# Patient Record
Sex: Male | Born: 1950 | Race: Black or African American | Hispanic: No | Marital: Married | Smoking: Former smoker
Health system: Southern US, Community
[De-identification: ages and names within clinical notes are randomized; demographics above are authoritative.]

## PROBLEM LIST (undated history)

## (undated) DIAGNOSIS — T7840XA Allergy, unspecified, initial encounter: Secondary | ICD-10-CM

## (undated) DIAGNOSIS — F319 Bipolar disorder, unspecified: Secondary | ICD-10-CM

## (undated) DIAGNOSIS — N400 Enlarged prostate without lower urinary tract symptoms: Secondary | ICD-10-CM

## (undated) DIAGNOSIS — F32A Depression, unspecified: Secondary | ICD-10-CM

## (undated) DIAGNOSIS — F329 Major depressive disorder, single episode, unspecified: Secondary | ICD-10-CM

## (undated) DIAGNOSIS — F419 Anxiety disorder, unspecified: Secondary | ICD-10-CM

## (undated) DIAGNOSIS — F988 Other specified behavioral and emotional disorders with onset usually occurring in childhood and adolescence: Secondary | ICD-10-CM

## (undated) DIAGNOSIS — C6290 Malignant neoplasm of unspecified testis, unspecified whether descended or undescended: Secondary | ICD-10-CM

## (undated) DIAGNOSIS — R51 Headache: Secondary | ICD-10-CM

## (undated) HISTORY — DX: Major depressive disorder, single episode, unspecified: F32.9

## (undated) HISTORY — DX: Other specified behavioral and emotional disorders with onset usually occurring in childhood and adolescence: F98.8

## (undated) HISTORY — PX: COLONOSCOPY W/ BIOPSIES AND POLYPECTOMY: SHX1376

## (undated) HISTORY — DX: Depression, unspecified: F32.A

## (undated) HISTORY — PX: TESTICLE REMOVAL: SHX68

## (undated) HISTORY — DX: Bipolar disorder, unspecified: F31.9

## (undated) HISTORY — DX: Allergy, unspecified, initial encounter: T78.40XA

---
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
## 1988-10-11 HISTORY — PX: FINGER FRACTURE SURGERY: SHX638

## 2001-12-08 ENCOUNTER — Encounter: Payer: Self-pay | Admitting: Urology

## 2001-12-08 ENCOUNTER — Ambulatory Visit (HOSPITAL_COMMUNITY): Admission: RE | Admit: 2001-12-08 | Discharge: 2001-12-08 | Payer: Self-pay | Admitting: Urology

## 2001-12-21 ENCOUNTER — Ambulatory Visit (HOSPITAL_BASED_OUTPATIENT_CLINIC_OR_DEPARTMENT_OTHER): Admission: RE | Admit: 2001-12-21 | Discharge: 2001-12-21 | Payer: Self-pay | Admitting: Urology

## 2001-12-21 ENCOUNTER — Encounter: Payer: Self-pay | Admitting: Urology

## 2001-12-21 ENCOUNTER — Encounter (INDEPENDENT_AMBULATORY_CARE_PROVIDER_SITE_OTHER): Payer: Self-pay

## 2001-12-27 ENCOUNTER — Encounter: Payer: Self-pay | Admitting: Urology

## 2001-12-27 ENCOUNTER — Encounter: Admission: RE | Admit: 2001-12-27 | Discharge: 2001-12-27 | Payer: Self-pay | Admitting: Urology

## 2001-12-31 ENCOUNTER — Encounter: Admission: RE | Admit: 2001-12-31 | Discharge: 2001-12-31 | Payer: Self-pay | Admitting: Urology

## 2001-12-31 ENCOUNTER — Encounter: Payer: Self-pay | Admitting: Urology

## 2002-01-14 ENCOUNTER — Ambulatory Visit: Admission: RE | Admit: 2002-01-14 | Discharge: 2002-03-30 | Payer: Self-pay | Admitting: *Deleted

## 2003-05-01 ENCOUNTER — Ambulatory Visit (HOSPITAL_COMMUNITY): Admission: RE | Admit: 2003-05-01 | Discharge: 2003-05-01 | Payer: Self-pay | Admitting: Urology

## 2004-02-08 ENCOUNTER — Ambulatory Visit: Payer: Self-pay | Admitting: Family Medicine

## 2004-02-09 ENCOUNTER — Encounter: Admission: RE | Admit: 2004-02-09 | Discharge: 2004-02-09 | Payer: Self-pay | Admitting: Family Medicine

## 2005-01-07 ENCOUNTER — Ambulatory Visit: Payer: Self-pay | Admitting: Family Medicine

## 2005-01-24 ENCOUNTER — Ambulatory Visit: Payer: Self-pay | Admitting: Family Medicine

## 2005-03-31 ENCOUNTER — Ambulatory Visit: Payer: Self-pay | Admitting: Internal Medicine

## 2006-05-27 ENCOUNTER — Ambulatory Visit: Payer: Self-pay | Admitting: Family Medicine

## 2006-07-29 ENCOUNTER — Ambulatory Visit: Payer: Self-pay | Admitting: Family Medicine

## 2006-08-10 ENCOUNTER — Ambulatory Visit: Payer: Self-pay | Admitting: Family Medicine

## 2006-08-10 LAB — CONVERTED CEMR LAB
ALT: 20 units/L (ref 0–53)
AST: 22 units/L (ref 0–37)
Albumin: 4.1 g/dL (ref 3.5–5.2)
Alkaline Phosphatase: 52 units/L (ref 39–117)
BUN: 17 mg/dL (ref 6–23)
Basophils Absolute: 0 10*3/uL (ref 0.0–0.1)
Basophils Relative: 0.9 % (ref 0.0–1.0)
CO2: 32 meq/L (ref 19–32)
Calcium: 10.1 mg/dL (ref 8.4–10.5)
Chloride: 110 meq/L (ref 96–112)
Cholesterol: 233 mg/dL (ref 0–200)
Creatinine, Ser: 1.2 mg/dL (ref 0.4–1.5)
Hemoglobin: 13.9 g/dL (ref 13.0–17.0)
Monocytes Absolute: 0.5 10*3/uL (ref 0.2–0.7)
Monocytes Relative: 10.6 % (ref 3.0–11.0)
Platelets: 198 10*3/uL (ref 150–400)
RBC: 4.24 M/uL (ref 4.22–5.81)
RDW: 11.7 % (ref 11.5–14.6)
Total Bilirubin: 1.3 mg/dL — ABNORMAL HIGH (ref 0.3–1.2)
Total CHOL/HDL Ratio: 4.5
Total Protein: 7.9 g/dL (ref 6.0–8.3)
Triglycerides: 102 mg/dL (ref 0–149)
VLDL: 20 mg/dL (ref 0–40)

## 2006-08-17 ENCOUNTER — Ambulatory Visit: Payer: Self-pay | Admitting: Family Medicine

## 2006-08-24 ENCOUNTER — Ambulatory Visit: Payer: Self-pay | Admitting: Family Medicine

## 2006-12-15 ENCOUNTER — Ambulatory Visit: Payer: Self-pay | Admitting: Family Medicine

## 2007-01-11 ENCOUNTER — Ambulatory Visit: Payer: Self-pay | Admitting: Family Medicine

## 2007-03-03 ENCOUNTER — Telehealth: Payer: Self-pay | Admitting: Internal Medicine

## 2007-05-24 ENCOUNTER — Telehealth: Payer: Self-pay | Admitting: Family Medicine

## 2007-06-22 ENCOUNTER — Telehealth (INDEPENDENT_AMBULATORY_CARE_PROVIDER_SITE_OTHER): Payer: Self-pay | Admitting: *Deleted

## 2007-08-02 ENCOUNTER — Ambulatory Visit: Payer: Self-pay | Admitting: Family Medicine

## 2007-08-02 LAB — CONVERTED CEMR LAB
ALT: 19 units/L (ref 0–53)
Albumin: 4.1 g/dL (ref 3.5–5.2)
Basophils Absolute: 0 10*3/uL (ref 0.0–0.1)
Basophils Relative: 0.8 % (ref 0.0–1.0)
Bilirubin Urine: NEGATIVE
Blood in Urine, dipstick: NEGATIVE
CO2: 30 meq/L (ref 19–32)
Calcium: 9.5 mg/dL (ref 8.4–10.5)
Cholesterol: 219 mg/dL (ref 0–200)
Creatinine, Ser: 1 mg/dL (ref 0.4–1.5)
Direct LDL: 142.1 mg/dL
Glucose, Bld: 99 mg/dL (ref 70–99)
Hemoglobin: 13.3 g/dL (ref 13.0–17.0)
Ketones, urine, test strip: NEGATIVE
Lymphocytes Relative: 29.6 % (ref 12.0–46.0)
MCHC: 34.6 g/dL (ref 30.0–36.0)
Neutro Abs: 2.2 10*3/uL (ref 1.4–7.7)
Nitrite: NEGATIVE
PSA: 2.29 ng/mL (ref 0.10–4.00)
RBC: 3.94 M/uL — ABNORMAL LOW (ref 4.22–5.81)
Total Protein: 7.8 g/dL (ref 6.0–8.3)
Urobilinogen, UA: 0.2
VLDL: 28 mg/dL (ref 0–40)

## 2007-09-13 ENCOUNTER — Ambulatory Visit: Payer: Self-pay | Admitting: Gastroenterology

## 2007-11-22 ENCOUNTER — Ambulatory Visit: Payer: Self-pay | Admitting: Gastroenterology

## 2008-02-28 ENCOUNTER — Ambulatory Visit: Payer: Self-pay | Admitting: Family Medicine

## 2008-05-18 ENCOUNTER — Ambulatory Visit: Payer: Self-pay | Admitting: Family Medicine

## 2008-06-22 ENCOUNTER — Telehealth: Payer: Self-pay | Admitting: Family Medicine

## 2008-07-26 ENCOUNTER — Ambulatory Visit: Payer: Self-pay | Admitting: Family Medicine

## 2008-07-26 LAB — CONVERTED CEMR LAB
AST: 17 units/L (ref 0–37)
BUN: 22 mg/dL (ref 6–23)
Basophils Absolute: 0.1 10*3/uL (ref 0.0–0.1)
Calcium: 9.3 mg/dL (ref 8.4–10.5)
Eosinophils Absolute: 0.2 10*3/uL (ref 0.0–0.7)
GFR calc non Af Amer: 88.4 mL/min (ref >60)
Glucose, Bld: 99 mg/dL (ref 70–99)
HCT: 37.9 % — ABNORMAL LOW (ref 39.0–52.0)
Lymphocytes Relative: 33.4 % (ref 12.0–46.0)
Lymphs Abs: 1.9 10*3/uL (ref 0.7–4.0)
MCHC: 34.8 g/dL (ref 30.0–36.0)
Monocytes Relative: 9.6 % (ref 3.0–12.0)
Nitrite: NEGATIVE
Platelets: 174 10*3/uL (ref 150.0–400.0)
RDW: 11.9 % (ref 11.5–14.6)
TSH: 1.72 microintl units/mL (ref 0.35–5.50)
Total Bilirubin: 1 mg/dL (ref 0.3–1.2)
Urobilinogen, UA: 0.2

## 2008-07-31 ENCOUNTER — Telehealth: Payer: Self-pay | Admitting: Family Medicine

## 2008-09-28 ENCOUNTER — Ambulatory Visit: Payer: Self-pay | Admitting: Internal Medicine

## 2009-02-01 ENCOUNTER — Ambulatory Visit: Payer: Self-pay | Admitting: Family Medicine

## 2010-06-28 NOTE — Op Note (Signed)
NAME:  Adam Herring, Adam Herring NO.:  [id number]   MEDICAL RECORD NO.:  [id number]                   PATIENT TYPE:  AMB   LOCATION:  [facility]                                 FACILITY:  [facility]   PHYSICIAN:  Excell Seltzer. Annabell Howells, M.D.                 DATE OF BIRTH:  03/09/1950   DATE OF PROCEDURE:  12/21/2001  DATE OF DISCHARGE:                                 OPERATIVE REPORT   PROCEDURE:  Left testicular exploration with biopsy and left radical  inguinal orchiectomy.   PREOPERATIVE DIAGNOSES:  Left testicular mass, rule out neoplasm versus  abscess.   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   SURGEON:  Excell Seltzer. Annabell Howells, M.D.   ANESTHESIA:  General.   SPECIMENS:  1. Left testicular frozen section biopsy suspicious for necrotic neoplasm.  2. Remainder of left testicle and cord.   COMPLICATIONS:  None.   INDICATIONS FOR PROCEDURE:  Adam Herring is a 60 year old black male who  originally presented with acute onset left scrotal pain and swelling. An  ultrasound demonstrated what appeared to be a necrotic mass versus abscess  in the left testicle. The inflammatory process resolved, the mass persisted  and it was felt that left testicular exploration through an inguinal  approach with biopsy was indicated with probable radical inguinal  orchiectomy.   FINDINGS AND PROCEDURE:  The patient was given p.o. Levaquin, he was taken  to the operating room where a general anesthetic was induced. He was placed  in the supine position, general anesthetic was induced. His left inguinal  area was shaved, he was prepped with Betadine solution and draped in the  usual sterile fashion. An oblique inguinal incision was made along the skin  lines just lateral and superior to the pubic tubercle. The subcutaneous  tissue was opened with the Bovie, a subcutaneous vein was cauterized and  divided. The Scarpa's fascia was opened, the external oblique fascia  was  identified and exposed and then incised along the lines of the fascial  fibers exposing the inguinal canal. The ilioinguinal nerve was identified  and was avoided. The cord was bluntly dissected out and a 1/4 inch Penrose  drain was doubly wrapped around the base of the cord, internal ring and  secured snugly to control the vascular supply of the testicle. The testicle  was then delivered from the scrotum and the gubernacular attachments were  taken down. At this point, the testicle is isolated on green towels, the  tunica vaginalis is opened and the testicle is bivalved exposing the lesion  in the lower pole of the testicle. It had a fleshy rind that measured  approximately 3-4 mm thick with a necrotic appearing yellowish center. There  was very little fluid and it was more consistent with necrotic tissue than  abscess. Some of both the central necrotic tissue and the rind was excised  using  Metzenbaum scissors and sent for frozen section analysis. This  demonstrated a necrotic cellular appearance more consistent with a necrotic  neoplasm than an inflammatory infectious process. Very few neutrophils were  seen. With this information, I elected to go ahead and perform the radical  orchiectomy. The cord was divided into two packets at the internal ring and  clamped with hemostats. It was then divided. The specimen was isolated and  removed off the field and each of the sections of the cord was doubly  ligated with #0 Vicryl ties. One tie was left long for future identification  if necessary. At this point, the inguinal canal was inspected for hemostasis  and no active bleeding was identified. The subcutaneous tissues were  infiltrated with about 10 cc of 0.25% Marcaine for postoperative pain  control. The external oblique fascia was reapproximated using running 3-0  Vicryl once again with care taken to avoid the ilioinguinal nerve. The  subcutaneous tissues were closed with interrupted  3-0 Vicryl stitches and  the skin was closed with an intracuticular 4-0 Vicryl. The wound was then  reinforced with tinctured Benzoin and Steri-Strips and a dressing was  applied. The scrotum was pulled down to ensure it was not entrapped. The  patient's anesthetic was reversed. He was removed to the recovery room in  stable condition. There were no complications.                                               Excell Seltzer. Annabell Howells, M.D.    JJW/MEDQ  D:  12/21/2001  T:  12/21/2001  Job:  [id number]

## 2010-11-20 ENCOUNTER — Other Ambulatory Visit: Payer: Self-pay | Admitting: Family Medicine

## 2011-04-01 ENCOUNTER — Telehealth: Payer: Self-pay | Admitting: Family Medicine

## 2011-04-01 NOTE — Telephone Encounter (Signed)
Spoke with patient and an appointment made 

## 2011-04-01 NOTE — Telephone Encounter (Addendum)
Pt. Wants to re-establish with Dr. Todd// states he may be having some issues with his prostate and has family history of prostate cancer.  However he wants to be seen as soon as possible but there wasn't anything available right away can he be fit in somewhere or what are his options..please call and let him know. Pt called back and also stated that he may have a possible STD, he received a call from a former girlfriend who alerted him about this recently..didn't state what it was but said he is extremely concerned.

## 2011-04-02 ENCOUNTER — Other Ambulatory Visit: Payer: Self-pay | Admitting: *Deleted

## 2011-04-02 ENCOUNTER — Ambulatory Visit (INDEPENDENT_AMBULATORY_CARE_PROVIDER_SITE_OTHER): Payer: BC Managed Care – PPO | Admitting: Family Medicine

## 2011-04-02 ENCOUNTER — Encounter: Payer: Self-pay | Admitting: Family Medicine

## 2011-04-02 DIAGNOSIS — N341 Nonspecific urethritis: Secondary | ICD-10-CM | POA: Insufficient documentation

## 2011-04-02 DIAGNOSIS — F319 Bipolar disorder, unspecified: Secondary | ICD-10-CM

## 2011-04-02 DIAGNOSIS — F3130 Bipolar disorder, current episode depressed, mild or moderate severity, unspecified: Secondary | ICD-10-CM

## 2011-04-02 LAB — BASIC METABOLIC PANEL
BUN: 26 mg/dL — ABNORMAL HIGH (ref 6–23)
Chloride: 107 mEq/L (ref 96–112)
Potassium: 5.3 mEq/L — ABNORMAL HIGH (ref 3.5–5.1)

## 2011-04-02 LAB — TSH: TSH: 0.7 u[IU]/mL (ref 0.35–5.50)

## 2011-04-02 LAB — CBC WITH DIFFERENTIAL/PLATELET
Basophils Absolute: 0 10*3/uL (ref 0.0–0.1)
Eosinophils Relative: 11.6 % — ABNORMAL HIGH (ref 0.0–5.0)
Lymphocytes Relative: 31.7 % (ref 12.0–46.0)
Monocytes Relative: 9 % (ref 3.0–12.0)
Neutrophils Relative %: 46.7 % (ref 43.0–77.0)
Platelets: 177 10*3/uL (ref 150.0–400.0)
WBC: 4.6 10*3/uL (ref 4.5–10.5)

## 2011-04-02 LAB — POCT URINALYSIS DIPSTICK
Bilirubin, UA: NEGATIVE
Ketones, UA: NEGATIVE
Leukocytes, UA: NEGATIVE
Nitrite, UA: NEGATIVE
Protein, UA: NEGATIVE

## 2011-04-02 LAB — LIPID PANEL
Cholesterol: 211 mg/dL — ABNORMAL HIGH (ref 0–200)
HDL: 55.8 mg/dL (ref >39.00)
Triglycerides: 127 mg/dL (ref 0.0–149.0)

## 2011-04-02 LAB — HEPATIC FUNCTION PANEL
ALT: 18 U/L (ref 0–53)
Albumin: 4.3 g/dL (ref 3.5–5.2)
Total Bilirubin: 0.5 mg/dL (ref 0.3–1.2)
Total Protein: 7.5 g/dL (ref 6.0–8.3)

## 2011-04-02 LAB — PSA: PSA: 2.75 ng/mL (ref 0.10–4.00)

## 2011-04-02 MED ORDER — METRONIDAZOLE 500 MG PO TABS: ORAL_TABLET | ORAL | Status: DC

## 2011-04-02 NOTE — Patient Instructions (Signed)
Take the Flagyl one twice daily for one week  Lab work today  Set up a 30 minute appointment sometime in the next couple weeks for general medical exam

## 2011-04-02 NOTE — Progress Notes (Signed)
  Subjective:    Patient ID: Adam Herring, male    DOB: February 15, 1950, 61 y.o.   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 He is currently treated by his psychiatrist Dr. Evelene Croon  every 6 months  His new girlfriend went for a physical examination and was told she had trichomonas. He is asymptomatic. I recommended he be treated  He also has not had a physical examination in over 3 years.   Review of Systems    general and genitourinary review of systems negative Objective:   Physical Exam  Well-developed well-nourished male in no acute distress genitalia exam normal      Assessment & Plan:  Contact with trichomonas asymptomatic treat

## 2011-04-03 LAB — LDL CHOLESTEROL, DIRECT: Direct LDL: 135.8 mg/dL

## 2011-04-14 ENCOUNTER — Encounter: Payer: Self-pay | Admitting: Family Medicine

## 2011-04-14 ENCOUNTER — Ambulatory Visit (INDEPENDENT_AMBULATORY_CARE_PROVIDER_SITE_OTHER): Payer: BC Managed Care – PPO | Admitting: Family Medicine

## 2011-04-14 DIAGNOSIS — N529 Male erectile dysfunction, unspecified: Secondary | ICD-10-CM

## 2011-04-14 DIAGNOSIS — Z860100 Personal history of colon polyps, unspecified: Secondary | ICD-10-CM

## 2011-04-14 DIAGNOSIS — F319 Bipolar disorder, unspecified: Secondary | ICD-10-CM

## 2011-04-14 DIAGNOSIS — Z23 Encounter for immunization: Secondary | ICD-10-CM

## 2011-04-14 DIAGNOSIS — F909 Attention-deficit hyperactivity disorder, unspecified type: Secondary | ICD-10-CM | POA: Insufficient documentation

## 2011-04-14 DIAGNOSIS — F3130 Bipolar disorder, current episode depressed, mild or moderate severity, unspecified: Secondary | ICD-10-CM

## 2011-04-14 DIAGNOSIS — Z0000 Encounter for general adult medical examination without abnormal findings: Secondary | ICD-10-CM

## 2011-04-14 MED ORDER — TADALAFIL 20 MG PO TABS: 20.0000 mg | ORAL_TABLET | Freq: Every day | ORAL | Status: DC | PRN

## 2011-04-14 NOTE — Patient Instructions (Signed)
Return sometime in the next 3-4 weeks for removal of the lesion on your left leg  We will call GI and get you set of for followup colonoscopy because of your history of colon polyps  Return once year for general physical examination sooner if any problems

## 2011-04-14 NOTE — Progress Notes (Signed)
Addended by: Kern Reap B on: 04/14/2011 01:53 PM   Modules accepted: Orders

## 2011-04-14 NOTE — Progress Notes (Signed)
  Subjective:    Patient ID: Adam Herring, male    DOB: October 28, 1950, 61 y.o.   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 He also has a history of underlying bipolar depression and is on Lamictal 150 mg daily. Both of these medications are given to him by a psychiatrist. He has been given disability for these 2 diagnoses  He uses Cialis when necessary for erectile dysfunction  He gets routine eye care, dental care, colonoscopy x2 both of which showed polyps. He's due for followup colonoscopy. Tetanus booster unknown will give him a booster today information given on shingles vaccine.  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    Review of Systems  Constitutional: Negative.   HENT: Negative.   Eyes: Negative.   Respiratory: Negative.   Cardiovascular: Negative.   Gastrointestinal: Negative.   Genitourinary: Negative.   Musculoskeletal: Negative.   Skin: Negative.   Neurological: Negative.   Hematological: Negative.   Psychiatric/Behavioral: Negative.        Objective:   Physical Exam  Constitutional: He is oriented to person, place, and time. He appears well-developed and well-nourished.  HENT:  Head: Normocephalic and atraumatic.  Right Ear: External ear normal.  Left Ear: External ear normal.  Nose: Nose normal.  Mouth/Throat: Oropharynx is clear and moist.  Eyes: Conjunctivae and EOM are normal. Pupils are equal, round, and reactive to light.  Neck: Normal range of motion. Neck supple. No JVD present. No tracheal deviation present. No thyromegaly present.  Cardiovascular: Normal rate, regular rhythm, normal heart sounds and intact distal pulses.  Exam reveals no gallop and no friction rub.   No murmur heard. Pulmonary/Chest: Effort normal and breath sounds  normal. No stridor. No respiratory distress. He has no wheezes. He has no rales. He exhibits no tenderness.  Abdominal: Soft. Bowel sounds are normal. He exhibits no distension and no mass. There is no tenderness. There is no rebound and no guarding.  Genitourinary: Rectum normal, prostate normal and penis normal. Guaiac negative stool. No penile tenderness.       Left testicle removed surgically right normal  Musculoskeletal: Normal range of motion. He exhibits no edema and no tenderness.  Lymphadenopathy:    He has no cervical adenopathy.  Neurological: He is alert and oriented to person, place, and time. He has normal reflexes. No cranial nerve deficit. He exhibits normal muscle tone.  Skin: Skin is warm and dry. No rash noted. No erythema. No pallor.       He has a sebaceous cyst on his back that I removed with a sterile needle also has a lesion on his left medial thigh this irritated itching growing would like removed.  Psychiatric: He has a normal mood and affect. His behavior is normal. Judgment and thought content normal.          Assessment & Plan:  Healthy male  History of adult ADHD continue Adderall 20 mg 3 times a day from psychiatrist  History of bipolar depression continue Lamictal 150 mg daily via psychiatrist  Erectile dysfunction continue Cialis when necessary  Abnormal lesion left lower extremity return for removal  History of testicular cancer asymptomatic

## 2011-04-15 ENCOUNTER — Encounter: Payer: Self-pay | Admitting: Gastroenterology

## 2011-05-13 ENCOUNTER — Encounter: Payer: Self-pay | Admitting: Family Medicine

## 2011-05-13 ENCOUNTER — Ambulatory Visit (INDEPENDENT_AMBULATORY_CARE_PROVIDER_SITE_OTHER): Payer: BC Managed Care – PPO | Admitting: Family Medicine

## 2011-05-13 DIAGNOSIS — L720 Epidermal cyst: Secondary | ICD-10-CM | POA: Insufficient documentation

## 2011-05-13 DIAGNOSIS — L723 Sebaceous cyst: Secondary | ICD-10-CM

## 2011-05-13 MED ORDER — TRIAMCINOLONE ACETONIDE 0.025 % EX OINT: TOPICAL_OINTMENT | Freq: Two times a day (BID) | CUTANEOUS | Status: AC

## 2011-05-13 NOTE — Progress Notes (Signed)
  Subjective:    Patient ID: Adam Herring, male    DOB: [birth year], 61 y.o.   MRN: [id number]  HPI Adam Herring is a 62 year old male who comes in today for removal of an enlarging dark lesion on the medial portion of his left thigh  After informed consent he was taken to the treatment room the area was cleaned with alcohol anesthetized with 1% Xylocaine with epinephrine and removed with 3 mm margins.  Size of lesion was 10 mm x10 mm  The base was cauterized a dry sterile dressing was applied the lesion was sent for pathologic analysis it appears clinically to be an inclusion cyst rule out dysplastic nevus   Review of Systems    general and dermatologic review of systems otherwise negative Objective:   Physical Exam  Procedure see above      Assessment & Plan:  Lesion removal left lower extremity probable inclusion cyst rule out dysplastic nevus

## 2011-05-13 NOTE — Patient Instructions (Addendum)
Remove the Band-Aid tomorrow  Within 2 weeks we will call you the report is call you as an

## 2011-05-19 ENCOUNTER — Encounter: Payer: BC Managed Care – PPO | Admitting: Gastroenterology

## 2011-05-21 ENCOUNTER — Other Ambulatory Visit: Payer: Self-pay | Admitting: Family Medicine

## 2011-05-21 DIAGNOSIS — IMO0002 Reserved for concepts with insufficient information to code with codable children: Secondary | ICD-10-CM

## 2011-05-29 ENCOUNTER — Telehealth: Payer: Self-pay | Admitting: *Deleted

## 2011-05-29 NOTE — Telephone Encounter (Signed)
No show for previsit.  Pt. Will call at later date to [facility] colon and previsit

## 2011-06-11 ENCOUNTER — Encounter: Payer: BC Managed Care – PPO | Admitting: Gastroenterology

## 2011-11-03 ENCOUNTER — Encounter: Payer: Self-pay | Admitting: Gastroenterology

## 2012-06-10 ENCOUNTER — Encounter: Payer: Self-pay | Admitting: Family Medicine

## 2012-06-15 ENCOUNTER — Other Ambulatory Visit: Payer: BC Managed Care – PPO

## 2012-06-16 ENCOUNTER — Other Ambulatory Visit: Payer: BC Managed Care – PPO

## 2012-06-17 ENCOUNTER — Other Ambulatory Visit (INDEPENDENT_AMBULATORY_CARE_PROVIDER_SITE_OTHER): Payer: BC Managed Care – PPO

## 2012-06-17 ENCOUNTER — Other Ambulatory Visit: Payer: BC Managed Care – PPO

## 2012-06-17 DIAGNOSIS — Z0000 Encounter for general adult medical examination without abnormal findings: Secondary | ICD-10-CM

## 2012-06-17 LAB — BASIC METABOLIC PANEL
BUN: 29 mg/dL — ABNORMAL HIGH (ref 6–23)
CO2: 28 mEq/L (ref 19–32)
Chloride: 104 mEq/L (ref 96–112)
Creatinine, Ser: 1.1 mg/dL (ref 0.4–1.5)
Glucose, Bld: 101 mg/dL — ABNORMAL HIGH (ref 70–99)
Potassium: 5 mEq/L (ref 3.5–5.1)

## 2012-06-17 LAB — POCT URINALYSIS DIPSTICK
Bilirubin, UA: NEGATIVE
Blood, UA: NEGATIVE
Glucose, UA: NEGATIVE
Ketones, UA: NEGATIVE
Leukocytes, UA: NEGATIVE
Nitrite, UA: NEGATIVE
pH, UA: 6

## 2012-06-17 LAB — LIPID PANEL
Cholesterol: 212 mg/dL — ABNORMAL HIGH (ref 0–200)
Total CHOL/HDL Ratio: 4
Triglycerides: 94 mg/dL (ref 0.0–149.0)

## 2012-06-17 LAB — HEPATIC FUNCTION PANEL
Albumin: 4.2 g/dL (ref 3.5–5.2)
Bilirubin, Direct: 0.1 mg/dL (ref 0.0–0.3)
Total Bilirubin: 1.1 mg/dL (ref 0.3–1.2)
Total Protein: 7.5 g/dL (ref 6.0–8.3)

## 2012-06-17 LAB — CBC WITH DIFFERENTIAL/PLATELET
Eosinophils Absolute: 0.5 10*3/uL (ref 0.0–0.7)
HCT: 37.5 % — ABNORMAL LOW (ref 39.0–52.0)
Lymphs Abs: 1.6 10*3/uL (ref 0.7–4.0)
MCHC: 34.4 g/dL (ref 30.0–36.0)
MCV: 95.6 fl (ref 78.0–100.0)
Monocytes Absolute: 0.5 10*3/uL (ref 0.1–1.0)
Neutrophils Relative %: 49.7 % (ref 43.0–77.0)
Platelets: 197 10*3/uL (ref 150.0–400.0)

## 2012-06-17 LAB — TSH: TSH: 1.3 u[IU]/mL (ref 0.35–5.50)

## 2012-06-21 ENCOUNTER — Encounter: Payer: BC Managed Care – PPO | Admitting: Family Medicine

## 2012-06-23 ENCOUNTER — Encounter: Payer: BC Managed Care – PPO | Admitting: Family Medicine

## 2012-08-06 ENCOUNTER — Other Ambulatory Visit: Payer: BC Managed Care – PPO

## 2012-08-23 ENCOUNTER — Encounter: Payer: BC Managed Care – PPO | Admitting: Family Medicine

## 2013-04-14 ENCOUNTER — Emergency Department (HOSPITAL_COMMUNITY)
Admission: EM | Admit: 2013-04-14 | Discharge: 2013-04-14 | Disposition: A | Discharge status: Home / Self Care | Payer: BC Managed Care – PPO | Attending: Emergency Medicine | Admitting: Emergency Medicine

## 2013-04-14 ENCOUNTER — Emergency Department (HOSPITAL_COMMUNITY): Payer: BC Managed Care – PPO

## 2013-04-14 ENCOUNTER — Encounter (HOSPITAL_COMMUNITY): Payer: Self-pay | Admitting: Emergency Medicine

## 2013-04-14 DIAGNOSIS — Z8547 Personal history of malignant neoplasm of testis: Secondary | ICD-10-CM | POA: Insufficient documentation

## 2013-04-14 DIAGNOSIS — Z79899 Other long term (current) drug therapy: Secondary | ICD-10-CM | POA: Insufficient documentation

## 2013-04-14 DIAGNOSIS — IMO0002 Reserved for concepts with insufficient information to code with codable children: Secondary | ICD-10-CM | POA: Insufficient documentation

## 2013-04-14 DIAGNOSIS — Y9389 Activity, other specified: Secondary | ICD-10-CM | POA: Insufficient documentation

## 2013-04-14 DIAGNOSIS — W230XXA Caught, crushed, jammed, or pinched between moving objects, initial encounter: Secondary | ICD-10-CM | POA: Insufficient documentation

## 2013-04-14 DIAGNOSIS — Y929 Unspecified place or not applicable: Secondary | ICD-10-CM | POA: Insufficient documentation

## 2013-04-14 DIAGNOSIS — F988 Other specified behavioral and emotional disorders with onset usually occurring in childhood and adolescence: Secondary | ICD-10-CM | POA: Insufficient documentation

## 2013-04-14 DIAGNOSIS — F319 Bipolar disorder, unspecified: Secondary | ICD-10-CM | POA: Insufficient documentation

## 2013-04-14 DIAGNOSIS — Z792 Long term (current) use of antibiotics: Secondary | ICD-10-CM | POA: Insufficient documentation

## 2013-04-14 DIAGNOSIS — J45909 Unspecified asthma, uncomplicated: Secondary | ICD-10-CM | POA: Insufficient documentation

## 2013-04-14 DIAGNOSIS — S68625A Partial traumatic transphalangeal amputation of left ring finger, initial encounter: Secondary | ICD-10-CM

## 2013-04-14 MED ORDER — HYDROCODONE-ACETAMINOPHEN 5-325 MG PO TABS
2.0000 | ORAL_TABLET | Freq: Once | ORAL | Status: AC
Start: 2013-04-14 — End: 2013-04-14
  Administered 2013-04-14: 2 via ORAL
  Filled 2013-04-14: qty 2

## 2013-04-14 MED ORDER — HYDROCODONE-ACETAMINOPHEN 5-325 MG PO TABS: 2.0000 | ORAL_TABLET | ORAL | Status: DC | PRN

## 2013-04-14 MED ORDER — CEPHALEXIN 500 MG PO CAPS: 500.0000 mg | ORAL_CAPSULE | Freq: Four times a day (QID) | ORAL | Status: DC

## 2013-04-14 MED ORDER — BACITRACIN 500 UNIT/GM EX OINT
1.0000 "application " | TOPICAL_OINTMENT | Freq: Two times a day (BID) | CUTANEOUS | Status: DC
  Administered 2013-04-14: 1 via TOPICAL

## 2013-04-14 NOTE — ED Provider Notes (Signed)
CSN: [id number]     Arrival date & time 04/14/13  0042 History   First MD Initiated Contact with Patient 04/14/13 0247     Chief Complaint  Patient presents with  . Finger Injury     (Consider location/radiation/quality/duration/timing/severity/associated sxs/prior Treatment) HPI Comments: 63 year old male presents with acute onset of injury to his distal left fourth finger which occurred just prior to arrival when his finger got caught in a wood working instrument causing a partial amputation of the tip of the finger. Symptoms are persistent, worse with palpation, associated with a small amount of bleeding though a dressing was placed prior to arrival to help control the bleeding.  The history is provided by the patient.    Past Medical History  Diagnosis Date  . ADD (attention deficit disorder)   . Allergy   . Asthma   . Cancer     testicular  . Depression   . Bipolar 1 disorder    Past Surgical History  Procedure Laterality Date  . Colon surgery     Family History  Problem Relation Age of Onset  . Asthma Other   . Hypertension Other   . Cancer Other    History  Substance Use Topics  . Smoking status: Never Smoker   . Smokeless tobacco: Not on file  . Alcohol Use: Yes    Review of Systems  Skin: Positive for wound.  Neurological: Negative for numbness.      Allergies  Review of patient's allergies indicates no known allergies.  Home Medications   Current Outpatient Rx  Name  Route  Sig  Dispense  Refill  . albuterol (PROVENTIL HFA;VENTOLIN HFA) 108 (90 BASE) MCG/ACT inhaler   Inhalation   Inhale 2 puffs into the lungs every 6 (six) hours as needed.         Marland Kitchen amphetamine-dextroamphetamine (ADDERALL) 20 MG tablet   Oral   Take 20 mg by mouth 3 (three) times daily.         . cephALEXin (KEFLEX) 500 MG capsule   Oral   Take 1 capsule (500 mg total) by mouth 4 (four) times daily.   40 capsule   0   . HYDROcodone-acetaminophen (NORCO/VICODIN) 5-325  MG per tablet   Oral   Take 2 tablets by mouth every 4 (four) hours as needed.   10 tablet   0   . lamoTRIgine (LAMICTAL) 150 MG tablet   Oral   Take 150 mg by mouth daily.         . tadalafil (CIALIS) 20 MG tablet   Oral   Take 1 tablet (20 mg total) by mouth daily as needed for erectile dysfunction.   6 tablet   11     Must be seen for future refills - last seen 2010    BP 139/89  Pulse 74  Temp(Src) 97.9 F (36.6 C) (Oral)  Resp 18  SpO2 100% Physical Exam  Constitutional: He appears well-developed and well-nourished. No distress.  HENT:  Head: Normocephalic.  Eyes: Conjunctivae are normal. No scleral icterus.  Cardiovascular: Normal rate and regular rhythm.   Pulmonary/Chest: Effort normal and breath sounds normal.  Musculoskeletal: Normal range of motion. He exhibits tenderness ( ttp over the laceration site). He exhibits no edema.  Tender palpation over the laceration and amputation site. The distal fourth finger with oblique laceration through the nailbed and nail, bleeding controlled, bone exposed, small amount of distal tissue flap with a large amount of missing fat pad and distal  finger tuft tissue.  Neurological: He is alert. Coordination normal.  Sensation and motor intact  Skin: Skin is warm and dry. He is not diaphoretic.    ED Course  NERVE BLOCK Date/Time: 04/14/2013 3:30 AM Performed by: Noemi Chapel D Authorized by: Noemi Chapel D Consent: Verbal consent obtained. Consent given by: patient Time out: Immediately prior to procedure a "time out" was called to verify the correct patient, procedure, equipment, support staff and site/side marked as required. Indications: pain relief and debridement Body area: upper extremity Nerve: digital Laterality: left Patient sedated: no Preparation: Patient was prepped and draped in the usual sterile fashion. Patient position: sitting Needle gauge: 25 G Location technique: anatomical landmarks Local  anesthetic: lidocaine 1% without epinephrine Anesthetic total: 5 ml Outcome: pain improved Patient tolerance: Patient tolerated the procedure well with no immediate complications.   (including critical care time) Labs Review Labs Reviewed - No data to display Imaging Review Dg Finger Ring Left  04/14/2013   CLINICAL DATA:  Injury  EXAM: LEFT RING FINGER 2+V  COMPARISON:  None.  FINDINGS: Bandaging material overlies the distal left fourth digit, somewhat limiting in evaluation for fine osseous detail. Has been resection through the distal aspect of the left fourth distal phalanx. The distal margin is fairly clean and sharply marginated. The left fourth DIP remains approximated. No retained foreign body.  IMPRESSION: Partial amputation of the left fourth distal phalanx. No retained foreign body.   Electronically Signed   By: Jeannine Boga M.D.   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 Apolonio Schneiders of hand surgery who will see the patient at 10:30 in the morning and is in agreement with local wound care and antibiotics. Digital block performed with good anesthesia, the wound probed, foreign bodies irrigated and removed.  Discussed with Dr. Apolonio Schneiders who will see the patient in the office today at 10:30.  Dressing placed, nonadherent, antibiotic prescription as outpatient.  Meds given in ED:  Medications  HYDROcodone-acetaminophen (NORCO/VICODIN) 5-325 MG per tablet 2 tablet (not administered)  bacitracin ointment 1 application (not administered)    New Prescriptions   CEPHALEXIN (KEFLEX) 500 MG CAPSULE    Take 1 capsule (500 mg total) by mouth 4 (four) times daily.   HYDROCODONE-ACETAMINOPHEN (NORCO/VICODIN) 5-325 MG PER TABLET    Take 2 tablets by mouth every 4 (four) hours as needed.        Johnna Acosta, MD 04/14/13 [id number]

## 2013-04-14 NOTE — ED Notes (Signed)
Pt. injured his left 4th finger this evening while using a wood working instrument , presents with deep laceration at distal left 4th finger . Dressing applied at triage .

## 2013-09-05 ENCOUNTER — Encounter (HOSPITAL_COMMUNITY): Payer: Self-pay | Admitting: Emergency Medicine

## 2013-09-05 ENCOUNTER — Emergency Department (HOSPITAL_COMMUNITY)
Admission: EM | Admit: 2013-09-05 | Discharge: 2013-09-05 | Disposition: A | Discharge status: Home / Self Care | Payer: BC Managed Care – PPO | Attending: Emergency Medicine | Admitting: Emergency Medicine

## 2013-09-05 DIAGNOSIS — K029 Dental caries, unspecified: Secondary | ICD-10-CM | POA: Insufficient documentation

## 2013-09-05 DIAGNOSIS — J45909 Unspecified asthma, uncomplicated: Secondary | ICD-10-CM | POA: Insufficient documentation

## 2013-09-05 DIAGNOSIS — Z8547 Personal history of malignant neoplasm of testis: Secondary | ICD-10-CM | POA: Insufficient documentation

## 2013-09-05 DIAGNOSIS — Z79899 Other long term (current) drug therapy: Secondary | ICD-10-CM | POA: Diagnosis not present

## 2013-09-05 DIAGNOSIS — F411 Generalized anxiety disorder: Secondary | ICD-10-CM | POA: Diagnosis not present

## 2013-09-05 DIAGNOSIS — F319 Bipolar disorder, unspecified: Secondary | ICD-10-CM | POA: Diagnosis not present

## 2013-09-05 DIAGNOSIS — K0889 Other specified disorders of teeth and supporting structures: Secondary | ICD-10-CM

## 2013-09-05 DIAGNOSIS — Z792 Long term (current) use of antibiotics: Secondary | ICD-10-CM | POA: Diagnosis not present

## 2013-09-05 DIAGNOSIS — K089 Disorder of teeth and supporting structures, unspecified: Secondary | ICD-10-CM | POA: Insufficient documentation

## 2013-09-05 MED ORDER — CLINDAMYCIN HCL 300 MG PO CAPS
300.0000 mg | ORAL_CAPSULE | Freq: Once | ORAL | Status: AC
  Administered 2013-09-05: 300 mg via ORAL
  Filled 2013-09-05: qty 1

## 2013-09-05 MED ORDER — CLINDAMYCIN HCL 150 MG PO CAPS: 150.0000 mg | ORAL_CAPSULE | Freq: Four times a day (QID) | ORAL | Status: DC

## 2013-09-05 MED ORDER — ONDANSETRON 4 MG PO TBDP
4.0000 mg | ORAL_TABLET | Freq: Once | ORAL | Status: AC
  Administered 2013-09-05: 4 mg via ORAL
  Filled 2013-09-05: qty 1

## 2013-09-05 NOTE — ED Provider Notes (Signed)
Medical screening examination/treatment/procedure(s) were performed by non-physician practitioner and as supervising physician I was immediately available for consultation/collaboration.   EKG Interpretation None       Ravleen Ries K Mccartney Brucks-Rasch, MD 09/05/13 [phone]

## 2013-09-05 NOTE — ED Notes (Signed)
Provider at bedside

## 2013-09-05 NOTE — Discharge Instructions (Signed)
Dental Pain °A tooth ache may be caused by cavities (tooth decay). Cavities expose the nerve of the tooth to air and hot or cold temperatures. It may come from an infection or abscess (also called a boil or furuncle) around your tooth. It is also often caused by dental caries (tooth decay). This causes the pain you are having. °DIAGNOSIS  °Your caregiver can diagnose this problem by exam. °TREATMENT  °· If caused by an infection, it may be treated with medications which kill germs (antibiotics) and pain medications as prescribed by your caregiver. Take medications as directed. °· Only take over-the-counter or prescription medicines for pain, discomfort, or fever as directed by your caregiver. °· Whether the tooth ache today is caused by infection or dental disease, you should see your dentist as soon as possible for further care. °SEEK MEDICAL CARE IF: °The exam and treatment you received today has been provided on an emergency basis only. This is not a substitute for complete medical or dental care. If your problem worsens or new problems (symptoms) appear, and you are unable to meet with your dentist, call or return to this location. °SEEK IMMEDIATE MEDICAL CARE IF:  °· You have a fever. °· You develop redness and swelling of your face, jaw, or neck. °· You are unable to open your mouth. °· You have severe pain uncontrolled by pain medicine. °MAKE SURE YOU:  °· Understand these instructions. °· Will watch your condition. °· Will get help right away if you are not doing well or get worse. °Document Released: 01/27/2005 Document Revised: 04/21/2011 Document Reviewed: 09/15/2007 °ExitCare® Patient Information ©2015 [employer]. This information is not intended to replace advice given to you by your health care provider. Make sure you discuss any questions you have with your health care provider. ° °Dental Care and Dentist Visits °Dental care supports good overall health. Regular dental visits can also help you  avoid dental pain, bleeding, infection, and other more serious health problems in the future. It is important to keep the mouth healthy because diseases in the teeth, gums, and other oral tissues can spread to other areas of the body. Some problems, such as diabetes, heart disease, and pre-term labor have been associated with poor oral health.  °See your dentist every 6 months. If you experience emergency problems such as a toothache or broken tooth, go to the dentist right away. If you see your dentist regularly, you may catch problems early. It is easier to be treated for problems in the early stages.  °WHAT TO EXPECT AT A DENTIST VISIT  °Your dentist will look for many common oral health problems and recommend proper treatment. At your regular dental visit, you can expect: °· Gentle cleaning of the teeth and gums. This includes scraping and polishing. This helps to remove the sticky substance around the teeth and gums (plaque). Plaque forms in the mouth shortly after eating. Over time, plaque hardens on the teeth as tartar. If tartar is not removed regularly, it can cause problems. Cleaning also helps remove stains. °· Periodic X-rays. These pictures of the teeth and supporting bone will help your dentist assess the health of your teeth. °· Periodic fluoride treatments. Fluoride is a natural mineral shown to help strengthen teeth. Fluoride treatment involves applying a fluoride gel or varnish to the teeth. It is most commonly done in children. °· Examination of the mouth, tongue, jaws, teeth, and gums to look for any oral health problems, such as: °¨ Cavities (dental caries). This is   decay on the tooth caused by plaque, sugar, and acid in the mouth. It is best to catch a cavity when it is small. °¨ Inflammation of the gums caused by plaque buildup (gingivitis). °¨ Problems with the mouth or malformed or misaligned teeth. °¨ Oral cancer or other diseases of the soft tissues or jaws.  °KEEP YOUR TEETH AND GUMS  HEALTHY °For healthy teeth and gums, follow these general guidelines as well as your dentist's specific advice: °· Have your teeth professionally cleaned at the dentist every 6 months. °· Brush twice daily with a fluoride toothpaste. °· Floss your teeth daily.  °· Ask your dentist if you need fluoride supplements, treatments, or fluoride toothpaste. °· Eat a healthy diet. Reduce foods and drinks with added sugar. °· Avoid smoking. °TREATMENT FOR ORAL HEALTH PROBLEMS °If you have oral health problems, treatment varies depending on the conditions present in your teeth and gums. °· Your caregiver will most likely recommend good oral hygiene at each visit. °· For cavities, gingivitis, or other oral health disease, your caregiver will perform a procedure to treat the problem. This is typically done at a separate appointment. Sometimes your caregiver will refer you to another dental specialist for specific tooth problems or for surgery. °SEEK IMMEDIATE DENTAL CARE IF: °· You have pain, bleeding, or soreness in the gum, tooth, jaw, or mouth area. °· A permanent tooth becomes loose or separated from the gum socket. °· You experience a blow or injury to the mouth or jaw area. °Document Released: 10/09/2010 Document Revised: 04/21/2011 Document Reviewed: 10/09/2010 °ExitCare® Patient Information ©2015 [employer]. This information is not intended to replace advice given to you by your health care provider. Make sure you discuss any questions you have with your health care provider. ° °

## 2013-09-05 NOTE — ED Provider Notes (Signed)
CSN: [id number]     Arrival date & time 09/05/13  0535 History   First MD Initiated Contact with Patient 09/05/13 [phone]     Chief Complaint  Patient presents with  . Anxiety     (Consider location/radiation/quality/duration/timing/severity/associated sxs/prior Treatment) HPI  Patient presents to the ED by EMS for multiple complaints but his main concern is dental pain. The patient appears anxious and he says his car is out of commission and he had a really rough night last night. He has been tasting something bad from his left lower molar with pain. He feels that he has had more drooling than normal on his pillow at night and he has been feeling weak for the past 4-5 weeks. He feels that stress and anxiety could be part of what is causing his symptoms. He doesn't see his psychiatrist for a few more days. He does see his dentist tomorrow. He denies fevers, SOB, CP, weakness, abdominal pains, confusion.  Vital signs WNL.  Past Medical History  Diagnosis Date  . ADD (attention deficit disorder)   . Allergy   . Asthma   . Cancer     testicular  . Depression   . Bipolar 1 disorder    Past Surgical History  Procedure Laterality Date  . Colon surgery     Family History  Problem Relation Age of Onset  . Asthma Other   . Hypertension Other   . Cancer Other    History  Substance Use Topics  . Smoking status: Never Smoker   . Smokeless tobacco: Not on file  . Alcohol Use: Yes     Comment: weekly (was a couple of beers daily)     Review of Systems  HENT: Positive for dental problem.   All other systems reviewed and are negative.    Allergies  Review of patient's allergies indicates no known allergies.  Home Medications   Prior to Admission medications   Medication Sig Start Date End Date Taking? Authorizing Provider  albuterol (PROVENTIL HFA;VENTOLIN HFA) 108 (90 BASE) MCG/ACT inhaler Inhale 2 puffs into the lungs every 6 (six) hours as needed.    Historical Provider, MD   amphetamine-dextroamphetamine (ADDERALL) 20 MG tablet Take 20 mg by mouth 3 (three) times daily.    Historical Provider, MD  cephALEXin (KEFLEX) 500 MG capsule Take 1 capsule (500 mg total) by mouth 4 (four) times daily. 04/14/13   Johnna Acosta, MD  clindamycin (CLEOCIN) 150 MG capsule Take 1 capsule (150 mg total) by mouth every 6 (six) hours. 09/05/13   Chesley Valls Marilu Favre, PA-C  HYDROcodone-acetaminophen (NORCO/VICODIN) 5-325 MG per tablet Take 2 tablets by mouth every 4 (four) hours as needed. 04/14/13   Johnna Acosta, MD  lamoTRIgine (LAMICTAL) 150 MG tablet Take 150 mg by mouth daily.    Historical Provider, MD  tadalafil (CIALIS) 20 MG tablet Take 1 tablet (20 mg total) by mouth daily as needed for erectile dysfunction. 04/14/11   Dorena Cookey, MD   BP 158/90  Pulse 80  Temp(Src) 97.1 F (36.2 C) (Oral)  Resp 12  SpO2 100% Physical Exam  Nursing note and vitals reviewed. Constitutional: He is oriented to person, place, and time. He appears well-developed and well-nourished. No distress.  HENT:  Head: Normocephalic and atraumatic.  Mouth/Throat: Dental caries present.  Eyes: Conjunctivae and EOM are normal. Pupils are equal, round, and reactive to light.  Neck: Normal range of motion. Neck supple.  Cardiovascular: Normal rate and regular rhythm.  Pulmonary/Chest: Effort normal and breath sounds normal. He has no wheezes.  Abdominal: Soft.  Neurological: He is alert and oriented to person, place, and time. He has normal strength. No cranial nerve deficit or sensory deficit. GCS eye subscore is 4. GCS verbal subscore is 5. GCS motor subscore is 6.  Skin: Skin is warm and dry.  Psychiatric: His behavior is normal. His mood appears anxious. He exhibits a depressed mood. He expresses no suicidal ideation. He expresses no suicidal plans.    ED Course  Procedures (including critical care time) Labs Review Labs Reviewed - No data to display  Imaging Review No results found.   EKG  Interpretation None      MDM   Final diagnoses:  Toothache    Patient has many symptoms, I recommend he treat his dental disease with antibiotics first, then see his dentist, and his psychiatrist. He should then see his primary care provider for the remaining symptoms that he is having. A thorough physical exam was done and the only abnormal finding was the dental infection. No noticeable abscess, no drooling or difficulty breathing.  He reports having concerns with taking penicillins. Rx: Clindamycin  63 y.o.Adam Herring's evaluation in the Emergency Department is complete. It has been determined that no acute conditions requiring further emergency intervention are present at this time. The patient/guardian have been advised of the diagnosis and plan. We have discussed signs and symptoms that warrant return to the ED, such as changes or worsening in symptoms.  Vital signs are stable at discharge. Filed Vitals:   09/05/13 0543  BP: 158/90  Pulse: 80  Temp: 97.1 F (36.2 C)  Resp: 12    Patient/guardian has voiced understanding and agreed to follow-up with the PCP or specialist.     Linus Mako, PA-C 09/05/13 [phone]

## 2013-09-05 NOTE — ED Notes (Addendum)
Feels weak "lethargic" x 4-5 weeks. Multiple complaints--wakes up with drool on his pillow; cats have fleas and he says they bite him but unable to show me any bite areas; had a tick on him and he brushed it off, denies tick bite.  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  Right top molar pain with associated gum disease, seeing dentist tomorrow. Believes he is salivating too much and it's foamy; States he is under a lot of stress due to money problems.  Occasionally has nausea off and on. Has ADHD and depression.

## 2013-09-14 ENCOUNTER — Encounter: Payer: Self-pay | Admitting: Physician Assistant

## 2013-09-14 ENCOUNTER — Ambulatory Visit (INDEPENDENT_AMBULATORY_CARE_PROVIDER_SITE_OTHER): Payer: BC Managed Care – PPO | Admitting: Physician Assistant

## 2013-09-14 VITALS — BP 120/70 | HR 110 | Temp 98.3°F | Resp 18 | Wt 179.0 lb

## 2013-09-14 DIAGNOSIS — K056 Periodontal disease, unspecified: Secondary | ICD-10-CM

## 2013-09-14 DIAGNOSIS — R197 Diarrhea, unspecified: Secondary | ICD-10-CM

## 2013-09-14 DIAGNOSIS — D649 Anemia, unspecified: Secondary | ICD-10-CM

## 2013-09-14 DIAGNOSIS — K069 Disorder of gingiva and edentulous alveolar ridge, unspecified: Secondary | ICD-10-CM

## 2013-09-14 MED ORDER — PENICILLIN V POTASSIUM 500 MG PO TABS: 500.0000 mg | ORAL_TABLET | Freq: Three times a day (TID) | ORAL | Status: DC

## 2013-09-14 NOTE — Progress Notes (Signed)
Pre visit review using our clinic review tool, if applicable. No additional management support is needed unless otherwise documented below in the visit note. 

## 2013-09-14 NOTE — Progress Notes (Signed)
Subjective:    Patient ID: Adam Herring, male    DOB: 1951/01/17, 63 y.o.   MRN: [id number]  HPI Patient is a 63 y.o. male presenting for nausea. Pt states that he has had difficulty with his gums with increased salivation for some time now. He recently was diagnosed with a periodontal infection at the ED recently, and placed on Clindamycin empirically, 4 times daily for a 10 day course. He also saw his dentist the following day and is scheduled to have a periodontal workup. The clindamycin has made the pt terribly nauseous, and he started to experienced multiple episodes of diarrhea the following weekend. PT denies vomiting, nor heartburn. He denies blood with bowel movements. He states he hasn't been able to continue the clindamycin as directed due to the negative side effects. His last dose was last night. He also states that he has had a poor appetite due to both the periodontal concerns, and now the clindamycin. He states that he occasionally still has a loose stool, but the diarrhea is mostly resolved. He also has had a headache and been lethargic during this period associated with not feeling well from the antibiotic. Patient denies fevers, chills, nausea, vomiting, diarrhea, shortness of breath, chest pain, syncope.    Review of Systems As per HPI and are otherwise negative.   Past Medical History  Diagnosis Date  . ADD (attention deficit disorder)   . Allergy   . Asthma   . Cancer     testicular  . Depression   . Bipolar 1 disorder     History   Social History  . Marital Status: Married    Spouse Name: N/A    Number of Children: N/A  . Years of Education: N/A   Occupational History  . Not on file.   Social History Main Topics  . Smoking status: Never Smoker   . Smokeless tobacco: Not on file  . Alcohol Use: Yes     Comment: weekly (was a couple of beers daily)   . Drug Use: Yes    Special: Marijuana  . Sexual Activity: Not on file   Other Topics Concern  .  Not on file   Social History Narrative  . No narrative on file    Past Surgical History  Procedure Laterality Date  . Colon surgery      Family History  Problem Relation Age of Onset  . Asthma Other   . Hypertension Other   . Cancer Other     No Known Allergies  Current Outpatient Prescriptions on File Prior to Visit  Medication Sig Dispense Refill  . albuterol (PROVENTIL HFA;VENTOLIN HFA) 108 (90 BASE) MCG/ACT inhaler Inhale 2 puffs into the lungs every 6 (six) hours as needed.      Marland Kitchen amphetamine-dextroamphetamine (ADDERALL) 20 MG tablet Take 20 mg by mouth 3 (three) times daily.      . clindamycin (CLEOCIN) 150 MG capsule Take 1 capsule (150 mg total) by mouth every 6 (six) hours.  28 capsule  0  . lamoTRIgine (LAMICTAL) 150 MG tablet Take 150 mg by mouth daily.      . tadalafil (CIALIS) 20 MG tablet Take 1 tablet (20 mg total) by mouth daily as needed for erectile dysfunction.  6 tablet  11  . cephALEXin (KEFLEX) 500 MG capsule Take 1 capsule (500 mg total) by mouth 4 (four) times daily.  40 capsule  0  . HYDROcodone-acetaminophen (NORCO/VICODIN) 5-325 MG per tablet Take 2 tablets by mouth  every 4 (four) hours as needed.  10 tablet  0   No current facility-administered medications on file prior to visit.    EXAM: BP 120/70  Pulse 110  Temp(Src) 98.3 F (36.8 C) (Oral)  Resp 18  Wt 179 lb (81.194 kg)  SpO2 97%      Objective:   Physical Exam  Nursing note and vitals reviewed. Constitutional: He is oriented to person, place, and time. He appears well-developed and well-nourished. No distress.  HENT:  Head: Normocephalic and atraumatic.  Eyes: Conjunctivae and EOM are normal. Pupils are equal, round, and reactive to light.  Cardiovascular: Normal rate, regular rhythm and intact distal pulses.   Pulmonary/Chest: Effort normal and breath sounds normal. No respiratory distress. He exhibits no tenderness.  Abdominal: Soft. Bowel sounds are normal. He exhibits no  distension and no mass. There is tenderness (mild llq tenderness to deep palpation.). There is no rebound and no guarding.  Neurological: He is alert and oriented to person, place, and time.  Skin: Skin is warm and dry. No rash noted. He is not diaphoretic. No erythema. No pallor.  Psychiatric: His behavior is normal. Judgment and thought content normal.  Anxious/nervous.    Lab Results  Component Value Date   WBC 5.0 06/17/2012   HGB 12.9* 06/17/2012   HCT 37.5* 06/17/2012   PLT 197.0 06/17/2012   GLUCOSE 101* 06/17/2012   CHOL 212* 06/17/2012   TRIG 94.0 06/17/2012   HDL 53.50 06/17/2012   LDLDIRECT 133.1 06/17/2012   ALT 18 06/17/2012   AST 16 06/17/2012   NA 139 06/17/2012   K 5.0 06/17/2012   CL 104 06/17/2012   CREATININE 1.1 06/17/2012   BUN 29* 06/17/2012   CO2 28 06/17/2012   TSH 1.30 06/17/2012   PSA 3.46 06/17/2012         Assessment & Plan:  [name] was seen today for salviation increased.  Diagnoses and associated orders for this visit:  Diarrhea Comments: Related to Clindamycin use. Will have pt provide stool sample. - Ova and parasite examination - Fecal lactoferrin - Clostridium difficile EIA - Stool culture - CBC with Differential  Periodontal disease Comments: Swtich from Clindamycin to Pencillin. Close follow up with Dentist. - penicillin v potassium (VEETID) 500 MG tablet; Take 1 tablet (500 mg total) by mouth 3 (three) times daily.     Return precautions provided, and patient handout on diarrhea.  Plan to follow up in about 1 week with PCP to reassess, or for worsening or persistent symptoms despite treatment.  Patient Instructions  We are ordering stool studies and you will need to provide [place] with a stool sample. We will send a container home with you to provide the stool and return to [place] for testing.  Stop taking the Clindamycin.  Instead, Take penicillin 1 tab 3 times daily for 7 days.  Keep a close follow up with your dentist. Please call them and let them know that  we have made this medication change.  If emergency symptoms discussed during visit developed, seek medical attention immediately.  Followup with your PCP next week to reassess, or for worsening or persistent symptoms despite treatment.

## 2013-09-14 NOTE — Patient Instructions (Addendum)
We are ordering stool studies and you will need to provide [place] with a stool sample. We will send a container home with you to provide the stool and return to [place] for testing.  Stop taking the Clindamycin.  Instead, Take penicillin 1 tab 3 times daily for 7 days.  Keep a close follow up with your dentist. Please call them and let them know that we have made this medication change.  If emergency symptoms discussed during visit developed, seek medical attention immediately.  Followup with your PCP next week to reassess, or for worsening or persistent symptoms despite treatment.     Diarrhea Diarrhea is watery poop (stool). It can make you feel weak, tired, thirsty, or give you a dry mouth (signs of dehydration). Watery poop is a sign of another problem, most often an infection. It often lasts 2-3 days. It can last longer if it is a sign of something serious. Take care of yourself as told by your doctor. HOME CARE   Drink 1 cup (8 ounces) of fluid each time you have watery poop.  Do not drink the following fluids:  Those that contain simple sugars (fructose, glucose, galactose, lactose, sucrose, maltose).  Sports drinks.  Fruit juices.  Whole milk products.  Sodas.  Drinks with caffeine (coffee, tea, soda) or alcohol.  Oral rehydration solution may be used if the doctor says it is okay. You may make your own solution. Follow this recipe:   - teaspoon table salt.   teaspoon baking soda.   teaspoon salt substitute containing potassium chloride.  1 tablespoons sugar.  1 liter (34 ounces) of water.  Avoid the following foods:  High fiber foods, such as raw fruits and vegetables.  Nuts, seeds, and whole grain breads and cereals.   Those that are sweetened with sugar alcohols (xylitol, sorbitol, mannitol).  Try eating the following foods:  Starchy foods, such as rice, toast, pasta, low-sugar cereal, oatmeal, baked potatoes, crackers, and  bagels.  Bananas.  Applesauce.  Eat probiotic-rich foods, such as yogurt and milk products that are fermented.  Wash your hands well after each time you have watery poop.  Only take medicine as told by your doctor.  Take a warm bath to help lessen burning or pain from having watery poop. GET HELP RIGHT AWAY IF:   You cannot drink fluids without throwing up (vomiting).  You keep throwing up.  You have blood in your poop, or your poop looks black and tarry.  You do not pee (urinate) in 6-8 hours, or there is only a small amount of very dark pee.  You have belly (abdominal) pain that gets worse or stays in the same spot (localizes).  You are weak, dizzy, confused, or light-headed.  You have a very bad headache.  Your watery poop gets worse or does not get better.  You have a fever or lasting symptoms for more than 2-3 days.  You have a fever and your symptoms suddenly get worse. MAKE SURE YOU:   Understand these instructions.  Will watch your condition.  Will get help right away if you are not doing well or get worse. Document Released: 07/16/2007 Document Revised: 06/13/2013 Document Reviewed: 10/05/2011 [facility] Patient Information 2015 [place], [place]. This information is not intended to replace advice given to you by your health care provider. Make sure you discuss any questions you have with your health care provider.

## 2013-09-15 ENCOUNTER — Telehealth: Payer: Self-pay | Admitting: Physician Assistant

## 2013-09-15 DIAGNOSIS — D649 Anemia, unspecified: Secondary | ICD-10-CM

## 2013-09-15 LAB — CBC WITH DIFFERENTIAL/PLATELET
BASOS PCT: 0.9 % (ref 0.0–3.0)
Basophils Absolute: 0.1 10*3/uL (ref 0.0–0.1)
EOS ABS: 0.3 10*3/uL (ref 0.0–0.7)
EOS PCT: 5.7 % — AB (ref 0.0–5.0)
HCT: 25.8 % — ABNORMAL LOW (ref 39.0–52.0)
Hemoglobin: 8.8 g/dL — ABNORMAL LOW (ref 13.0–17.0)
LYMPHS PCT: 25.6 % (ref 12.0–46.0)
Lymphs Abs: 1.5 10*3/uL (ref 0.7–4.0)
MCHC: 34.1 g/dL (ref 30.0–36.0)
MCV: 94.1 fl (ref 78.0–100.0)
MONO ABS: 0.4 10*3/uL (ref 0.1–1.0)
Monocytes Relative: 6.9 % (ref 3.0–12.0)
Neutro Abs: 3.6 10*3/uL (ref 1.4–7.7)
Neutrophils Relative %: 60.9 % (ref 43.0–77.0)
Platelets: 225 10*3/uL (ref 150.0–400.0)
RBC: 2.74 Mil/uL — AB (ref 4.22–5.81)
RDW: 12.4 % (ref 11.5–15.5)
WBC: 6 10*3/uL (ref 4.0–10.5)

## 2013-09-15 NOTE — Telephone Encounter (Signed)
Called and spoke with pt and pt is aware.  Pt will come in on 09/16/13 and have more labs drawn.  Pt aware of appt at 11 am.

## 2013-09-16 ENCOUNTER — Other Ambulatory Visit: Payer: BC Managed Care – PPO

## 2013-09-16 ENCOUNTER — Other Ambulatory Visit: Payer: Self-pay | Admitting: Physician Assistant

## 2013-09-17 LAB — C. DIFFICILE GDH AND TOXIN A/B
C. difficile GDH: NOT DETECTED
C. difficile Toxin A/B: NOT DETECTED

## 2013-09-17 LAB — FECAL LACTOFERRIN, QUANT: Lactoferrin: NEGATIVE

## 2013-09-19 LAB — OVA AND PARASITE EXAMINATION: OP: NONE SEEN

## 2013-09-20 ENCOUNTER — Other Ambulatory Visit: Payer: Self-pay | Admitting: Physician Assistant

## 2013-09-20 DIAGNOSIS — D649 Anemia, unspecified: Secondary | ICD-10-CM

## 2013-09-20 LAB — STOOL CULTURE

## 2013-09-21 LAB — POC HEMOCCULT BLD/STL (HOME/3-CARD/SCREEN)
CARD #3 DATE: NEGATIVE
Card #2 Date: NEGATIVE
Card #3 Fecal Occult Blood, POC: NEGATIVE
FECAL OCCULT BLD: NEGATIVE
Fecal Occult Blood, POC: NEGATIVE
OCCULT BLOOD DATE: NEGATIVE

## 2013-09-21 NOTE — Addendum Note (Signed)
Addended by: Elmer Picker on: 09/21/2013 04:16 PM   Modules accepted: Orders

## 2013-09-22 ENCOUNTER — Ambulatory Visit: Payer: BC Managed Care – PPO | Admitting: Family Medicine

## 2013-09-29 ENCOUNTER — Ambulatory Visit (INDEPENDENT_AMBULATORY_CARE_PROVIDER_SITE_OTHER): Payer: BC Managed Care – PPO | Admitting: Family Medicine

## 2013-09-29 ENCOUNTER — Telehealth: Payer: Self-pay

## 2013-09-29 ENCOUNTER — Encounter: Payer: Self-pay | Admitting: Gastroenterology

## 2013-09-29 ENCOUNTER — Encounter: Payer: Self-pay | Admitting: Family Medicine

## 2013-09-29 VITALS — BP 140/90 | Temp 98.1°F | Wt 176.0 lb

## 2013-09-29 DIAGNOSIS — Z86010 Personal history of colonic polyps: Secondary | ICD-10-CM

## 2013-09-29 DIAGNOSIS — F909 Attention-deficit hyperactivity disorder, unspecified type: Secondary | ICD-10-CM

## 2013-09-29 DIAGNOSIS — F3130 Bipolar disorder, current episode depressed, mild or moderate severity, unspecified: Secondary | ICD-10-CM

## 2013-09-29 DIAGNOSIS — R634 Abnormal weight loss: Secondary | ICD-10-CM

## 2013-09-29 DIAGNOSIS — F319 Bipolar disorder, unspecified: Secondary | ICD-10-CM

## 2013-09-29 DIAGNOSIS — F902 Attention-deficit hyperactivity disorder, combined type: Secondary | ICD-10-CM

## 2013-09-29 DIAGNOSIS — D649 Anemia, unspecified: Secondary | ICD-10-CM | POA: Insufficient documentation

## 2013-09-29 LAB — BASIC METABOLIC PANEL
BUN: 65 mg/dL — AB (ref 6–23)
CO2: 21 mEq/L (ref 19–32)
CREATININE: 6.2 mg/dL — AB (ref 0.4–1.5)
Calcium: 9.6 mg/dL (ref 8.4–10.5)
Chloride: 112 mEq/L (ref 96–112)
GFR: 11.83 mL/min — AB (ref >60.00)
Glucose, Bld: 99 mg/dL (ref 70–99)
Potassium: 6 mEq/L — ABNORMAL HIGH (ref 3.5–5.1)
Sodium: 142 mEq/L (ref 135–145)

## 2013-09-29 LAB — HEPATIC FUNCTION PANEL
ALK PHOS: 55 U/L (ref 39–117)
ALT: 12 U/L (ref 0–53)
AST: 15 U/L (ref 0–37)
Albumin: 4 g/dL (ref 3.5–5.2)
BILIRUBIN TOTAL: 0.5 mg/dL (ref 0.2–1.2)
Bilirubin, Direct: 0.1 mg/dL (ref 0.0–0.3)
Total Protein: 7.8 g/dL (ref 6.0–8.3)

## 2013-09-29 LAB — CBC WITH DIFFERENTIAL/PLATELET
BASOS ABS: 0 10*3/uL (ref 0.0–0.1)
BASOS PCT: 0.9 % (ref 0.0–3.0)
EOS ABS: 0.3 10*3/uL (ref 0.0–0.7)
Eosinophils Relative: 5.2 % — ABNORMAL HIGH (ref 0.0–5.0)
LYMPHS ABS: 1.4 10*3/uL (ref 0.7–4.0)
Lymphocytes Relative: 26 % (ref 12.0–46.0)
MCHC: 33.2 g/dL (ref 30.0–36.0)
MCV: 95.7 fl (ref 78.0–100.0)
MONO ABS: 0.5 10*3/uL (ref 0.1–1.0)
Monocytes Relative: 8.9 % (ref 3.0–12.0)
NEUTROS ABS: 3.1 10*3/uL (ref 1.4–7.7)
Neutrophils Relative %: 59 % (ref 43.0–77.0)
Platelets: 237 10*3/uL (ref 150.0–400.0)
RDW: 12.6 % (ref 11.5–15.5)
WBC: 5.3 10*3/uL (ref 4.0–10.5)

## 2013-09-29 LAB — VITAMIN B12: VITAMIN B 12: 363 pg/mL (ref 211–911)

## 2013-09-29 LAB — TSH: TSH: 1.02 u[IU]/mL (ref 0.35–4.50)

## 2013-09-29 LAB — FERRITIN: Ferritin: 359.2 ng/mL — ABNORMAL HIGH (ref 22.0–322.0)

## 2013-09-29 LAB — IRON: Iron: 66 ug/dL (ref 42–165)

## 2013-09-29 NOTE — Progress Notes (Signed)
Pre visit review using our clinic review tool, if applicable. No additional management support is needed unless otherwise documented below in the visit note. 

## 2013-09-29 NOTE — Patient Instructions (Addendum)
Labs today  Begin iron.............. one tablet twice daily............  No aspirin or aspirin products  Return on Tuesday for followup  We will get you set up a consult in GI ASAP

## 2013-09-29 NOTE — Telephone Encounter (Signed)
Hemoglobin is 7.5 and hematocrit 22.6 and RBC is 2.36.   Dr. Sherren Mocha is aware.

## 2013-09-29 NOTE — Progress Notes (Signed)
   Subjective:    Patient ID: Adam Herring, male    DOB: Aug 10, 1950, 63 y.o.   MRN: [id number]  HPI  Adam Herring is a [age]-year-old male who comes in today for evaluation of multiple issues  He has a long-standing history of bipolar depression and ADD. He's followed by his psychiatrist. He's currently on Lamictal 200 mg daily.  He recently went to the hospital and was diagnosed as a panic attack. He was also seen here numerous diagnostic studies including stool x3 ON P. are negative. Hemoglobin was 8.8  He's never had anemia in the past. He's also noted a 15 pound involuntary weight loss over the last couple months.  He denies any fever chills sore throat earache cough nausea vomiting diarrhea or urinary tract complaints.  Review of Systems Review of systems again negative    Objective:   Physical Exam Well-developed well-nourished male no acute distress vital signs stable he is afebrile examination the conjunctiva extremities extremely pale eyes. Cardiopulmonary exam negative abdominal exam negative rectum normal prostate 1+ symmetrical BPH guaiac negative       Assessment & Plan:  Anemia,,,,,,,, unknown etiology begin workup,

## 2013-09-30 ENCOUNTER — Telehealth: Payer: Self-pay | Admitting: Family Medicine

## 2013-09-30 NOTE — Telephone Encounter (Signed)
Pt forgot to tell Dr.  Sherren Mocha about an accident he had with a piece of with a piece of wood working equipment in 03/15.  A tip of his finger was cut off.  He was treated by an orthopedist and a piece of bone was removed.  He thought this might be important.

## 2013-10-01 LAB — FOLATE: Folate: 10.5 ng/mL

## 2013-10-03 NOTE — Telephone Encounter (Signed)
Dr Sherren Mocha notified.

## 2013-10-04 ENCOUNTER — Other Ambulatory Visit (HOSPITAL_COMMUNITY): Payer: BC Managed Care – PPO

## 2013-10-04 ENCOUNTER — Encounter (HOSPITAL_COMMUNITY): Payer: Self-pay | Admitting: Emergency Medicine

## 2013-10-04 ENCOUNTER — Inpatient Hospital Stay (HOSPITAL_COMMUNITY)
Admission: EM | Admit: 2013-10-04 | Discharge: 2013-10-14 | DRG: 683 | Disposition: A | Discharge status: Home / Self Care | Payer: BC Managed Care – PPO | Attending: Internal Medicine | Admitting: Internal Medicine

## 2013-10-04 ENCOUNTER — Ambulatory Visit: Payer: BC Managed Care – PPO | Admitting: Family Medicine

## 2013-10-04 ENCOUNTER — Inpatient Hospital Stay (HOSPITAL_COMMUNITY): Payer: BC Managed Care – PPO

## 2013-10-04 DIAGNOSIS — Z825 Family history of asthma and other chronic lower respiratory diseases: Secondary | ICD-10-CM | POA: Diagnosis not present

## 2013-10-04 DIAGNOSIS — N401 Enlarged prostate with lower urinary tract symptoms: Secondary | ICD-10-CM | POA: Diagnosis present

## 2013-10-04 DIAGNOSIS — F3130 Bipolar disorder, current episode depressed, mild or moderate severity, unspecified: Secondary | ICD-10-CM | POA: Diagnosis present

## 2013-10-04 DIAGNOSIS — E872 Acidosis, unspecified: Secondary | ICD-10-CM | POA: Diagnosis present

## 2013-10-04 DIAGNOSIS — D638 Anemia in other chronic diseases classified elsewhere: Secondary | ICD-10-CM | POA: Diagnosis present

## 2013-10-04 DIAGNOSIS — Z86010 Personal history of colonic polyps: Secondary | ICD-10-CM

## 2013-10-04 DIAGNOSIS — Z8547 Personal history of malignant neoplasm of testis: Secondary | ICD-10-CM

## 2013-10-04 DIAGNOSIS — R338 Other retention of urine: Secondary | ICD-10-CM | POA: Diagnosis present

## 2013-10-04 DIAGNOSIS — R778 Other specified abnormalities of plasma proteins: Secondary | ICD-10-CM

## 2013-10-04 DIAGNOSIS — D649 Anemia, unspecified: Secondary | ICD-10-CM | POA: Diagnosis present

## 2013-10-04 DIAGNOSIS — N138 Other obstructive and reflux uropathy: Secondary | ICD-10-CM | POA: Diagnosis present

## 2013-10-04 DIAGNOSIS — F909 Attention-deficit hyperactivity disorder, unspecified type: Secondary | ICD-10-CM | POA: Diagnosis present

## 2013-10-04 DIAGNOSIS — R339 Retention of urine, unspecified: Secondary | ICD-10-CM | POA: Diagnosis present

## 2013-10-04 DIAGNOSIS — N179 Acute kidney failure, unspecified: Secondary | ICD-10-CM | POA: Diagnosis present

## 2013-10-04 DIAGNOSIS — Z8042 Family history of malignant neoplasm of prostate: Secondary | ICD-10-CM

## 2013-10-04 DIAGNOSIS — N1330 Unspecified hydronephrosis: Secondary | ICD-10-CM | POA: Diagnosis present

## 2013-10-04 DIAGNOSIS — E875 Hyperkalemia: Secondary | ICD-10-CM | POA: Diagnosis present

## 2013-10-04 DIAGNOSIS — Z79899 Other long term (current) drug therapy: Secondary | ICD-10-CM

## 2013-10-04 DIAGNOSIS — D472 Monoclonal gammopathy: Secondary | ICD-10-CM | POA: Diagnosis present

## 2013-10-04 DIAGNOSIS — J45909 Unspecified asthma, uncomplicated: Secondary | ICD-10-CM | POA: Diagnosis present

## 2013-10-04 DIAGNOSIS — F319 Bipolar disorder, unspecified: Secondary | ICD-10-CM | POA: Diagnosis present

## 2013-10-04 DIAGNOSIS — N320 Bladder-neck obstruction: Secondary | ICD-10-CM | POA: Diagnosis present

## 2013-10-04 DIAGNOSIS — R634 Abnormal weight loss: Secondary | ICD-10-CM

## 2013-10-04 LAB — COMPREHENSIVE METABOLIC PANEL
ALK PHOS: 63 U/L (ref 39–117)
ALT: 13 U/L (ref 0–53)
ANION GAP: 13 (ref 5–15)
AST: 15 U/L (ref 0–37)
Albumin: 3.9 g/dL (ref 3.5–5.2)
BILIRUBIN TOTAL: 0.2 mg/dL — AB (ref 0.3–1.2)
BUN: 60 mg/dL — AB (ref 6–23)
CHLORIDE: 107 meq/L (ref 96–112)
CO2: 21 mEq/L (ref 19–32)
Calcium: 9.6 mg/dL (ref 8.4–10.5)
Creatinine, Ser: 6.21 mg/dL — ABNORMAL HIGH (ref 0.50–1.35)
GFR calc non Af Amer: 9 mL/min — ABNORMAL LOW (ref >90)
GFR, EST AFRICAN AMERICAN: 10 mL/min — AB (ref >90)
GLUCOSE: 105 mg/dL — AB (ref 70–99)
Potassium: 6.2 mEq/L — ABNORMAL HIGH (ref 3.7–5.3)
Sodium: 141 mEq/L (ref 137–147)
TOTAL PROTEIN: 8.2 g/dL (ref 6.0–8.3)

## 2013-10-04 LAB — RENAL FUNCTION PANEL
ANION GAP: 13 (ref 5–15)
Albumin: 3.6 g/dL (ref 3.5–5.2)
BUN: 55 mg/dL — ABNORMAL HIGH (ref 6–23)
CO2: 19 mEq/L (ref 19–32)
Calcium: 9.5 mg/dL (ref 8.4–10.5)
Chloride: 110 mEq/L (ref 96–112)
Creatinine, Ser: 5.74 mg/dL — ABNORMAL HIGH (ref 0.50–1.35)
GFR calc non Af Amer: 9 mL/min — ABNORMAL LOW (ref >90)
GFR, EST AFRICAN AMERICAN: 11 mL/min — AB (ref >90)
GLUCOSE: 132 mg/dL — AB (ref 70–99)
POTASSIUM: 5.7 meq/L — AB (ref 3.7–5.3)
Phosphorus: 4.3 mg/dL (ref 2.3–4.6)
Sodium: 142 mEq/L (ref 137–147)

## 2013-10-04 LAB — PREPARE RBC (CROSSMATCH)

## 2013-10-04 LAB — CBC
HEMATOCRIT: 21.9 % — AB (ref 39.0–52.0)
HEMOGLOBIN: 7 g/dL — AB (ref 13.0–17.0)
MCH: 31 pg (ref 26.0–34.0)
MCHC: 32 g/dL (ref 30.0–36.0)
MCV: 96.9 fL (ref 78.0–100.0)
Platelets: 224 10*3/uL (ref 150–400)
RBC: 2.26 MIL/uL — ABNORMAL LOW (ref 4.22–5.81)
RDW: 12.2 % (ref 11.5–15.5)
WBC: 5 10*3/uL (ref 4.0–10.5)

## 2013-10-04 LAB — TYPE AND SCREEN
ABO/RH(D): B NEG
Antibody Screen: NEGATIVE

## 2013-10-04 LAB — IRON AND TIBC
IRON: 90 ug/dL (ref 42–135)
Saturation Ratios: 45 % (ref 20–55)
TIBC: 200 ug/dL — ABNORMAL LOW (ref 215–435)
UIBC: 110 ug/dL — ABNORMAL LOW (ref 125–400)

## 2013-10-04 LAB — SODIUM, URINE, RANDOM: SODIUM UR: 57 meq/L

## 2013-10-04 LAB — VITAMIN B12: VITAMIN B 12: 444 pg/mL (ref 211–911)

## 2013-10-04 LAB — URINALYSIS, ROUTINE W REFLEX MICROSCOPIC
BILIRUBIN URINE: NEGATIVE
Glucose, UA: NEGATIVE mg/dL
Hgb urine dipstick: NEGATIVE
KETONES UR: NEGATIVE mg/dL
NITRITE: NEGATIVE
Protein, ur: NEGATIVE mg/dL
Specific Gravity, Urine: 1.012 (ref 1.005–1.030)
Urobilinogen, UA: 0.2 mg/dL (ref 0.0–1.0)
pH: 5 (ref 5.0–8.0)

## 2013-10-04 LAB — LACTATE DEHYDROGENASE: LDH: 169 U/L (ref 94–250)

## 2013-10-04 LAB — FOLATE: Folate: >20 ng/mL

## 2013-10-04 LAB — URINE MICROSCOPIC-ADD ON

## 2013-10-04 LAB — TSH: TSH: 1.05 u[IU]/mL (ref 0.350–4.500)

## 2013-10-04 LAB — POC OCCULT BLOOD, ED: Fecal Occult Bld: NEGATIVE

## 2013-10-04 LAB — ABO/RH
ABO/RH(D): B NEG
ABO/RH(D): B NEG

## 2013-10-04 LAB — RETICULOCYTES
RBC.: 2.25 MIL/uL — ABNORMAL LOW (ref 4.22–5.81)
RETIC COUNT ABSOLUTE: 42.8 10*3/uL (ref 19.0–186.0)
Retic Ct Pct: 1.9 % (ref 0.4–3.1)

## 2013-10-04 LAB — CREATININE, URINE, RANDOM: CREATININE, URINE: 84.08 mg/dL

## 2013-10-04 LAB — FERRITIN: Ferritin: 486 ng/mL — ABNORMAL HIGH (ref 22–322)

## 2013-10-04 LAB — LITHIUM LEVEL

## 2013-10-04 MED ORDER — SODIUM POLYSTYRENE SULFONATE 15 GM/60ML PO SUSP
15.0000 g | Freq: Once | ORAL | Status: AC
  Administered 2013-10-04: 15 g via ORAL
  Filled 2013-10-04: qty 60

## 2013-10-04 MED ORDER — SODIUM CHLORIDE 0.9 % IV SOLN
INTRAVENOUS | Status: DC
  Administered 2013-10-04: 21:00:00 via INTRAVENOUS

## 2013-10-04 MED ORDER — SODIUM CHLORIDE 0.9 % IJ SOLN
3.0000 mL | Freq: Two times a day (BID) | INTRAMUSCULAR | Status: DC
  Administered 2013-10-04 – 2013-10-14 (×8): 3 mL via INTRAVENOUS

## 2013-10-04 MED ORDER — ONDANSETRON HCL 4 MG/2ML IJ SOLN: 4.0000 mg | Freq: Four times a day (QID) | INTRAMUSCULAR | Status: DC | PRN

## 2013-10-04 MED ORDER — LAMOTRIGINE 200 MG PO TABS
200.0000 mg | ORAL_TABLET | Freq: Every day | ORAL | Status: DC
  Administered 2013-10-05 – 2013-10-14 (×10): 200 mg via ORAL
  Filled 2013-10-04 (×11): qty 1

## 2013-10-04 MED ORDER — ACETAMINOPHEN 650 MG RE SUPP: 650.0000 mg | Freq: Four times a day (QID) | RECTAL | Status: DC | PRN

## 2013-10-04 MED ORDER — ALBUTEROL SULFATE (2.5 MG/3ML) 0.083% IN NEBU: 2.0000 mL | INHALATION_SOLUTION | Freq: Four times a day (QID) | RESPIRATORY_TRACT | Status: DC | PRN

## 2013-10-04 MED ORDER — ACETAMINOPHEN 325 MG PO TABS
650.0000 mg | ORAL_TABLET | Freq: Four times a day (QID) | ORAL | Status: DC | PRN
  Administered 2013-10-11: 650 mg via ORAL
  Filled 2013-10-04: qty 2

## 2013-10-04 MED ORDER — SODIUM POLYSTYRENE SULFONATE 15 GM/60ML PO SUSP: 50.0000 g | Freq: Once | ORAL | Status: DC

## 2013-10-04 MED ORDER — ONDANSETRON HCL 4 MG PO TABS
4.0000 mg | ORAL_TABLET | Freq: Four times a day (QID) | ORAL | Status: DC | PRN
Start: 2013-10-04 — End: 2013-10-14

## 2013-10-04 MED ORDER — SODIUM CHLORIDE 0.9 % IV SOLN
INTRAVENOUS | Status: DC
  Administered 2013-10-05 – 2013-10-06 (×2): via INTRAVENOUS

## 2013-10-04 MED ORDER — HEPARIN SODIUM (PORCINE) 5000 UNIT/ML IJ SOLN
5000.0000 [IU] | Freq: Three times a day (TID) | INTRAMUSCULAR | Status: DC
  Administered 2013-10-04 – 2013-10-05 (×2): 5000 [IU] via SUBCUTANEOUS
  Filled 2013-10-04 (×3): qty 1

## 2013-10-04 MED ORDER — SODIUM CHLORIDE 0.9 % IV SOLN: Freq: Once | INTRAVENOUS | Status: DC

## 2013-10-04 NOTE — ED Provider Notes (Signed)
CSN: [id number]     Arrival date & time 10/04/13  1143 History   First MD Initiated Contact with Patient 10/04/13 1208     Chief Complaint  Patient presents with  . low hgb, elevated creatinine      (Consider location/radiation/quality/duration/timing/severity/associated sxs/prior Treatment) HPI Comments: The patient presents to the ER at the request of primary care doctor. Patient has been feeling ill for the last couple of weeks, has been treated for anxiety and dental pain in the ER. Patient saw his primary doctor was found to be anemic recently. He was told to take iron. Number doctor has been monitoring this, 2 blood work several days ago which has now returned. Patient found to be profoundly anemic with acute renal failure.  Upon her out to the ER, patient says that he is feeling better after he started taking the iron. He has had generalized weakness but has not had any chest pain, shortness of breath. Patient denies nausea, vomiting, diarrhea. He has had no abdominal pain. He denies rectal bleeding.   Past Medical History  Diagnosis Date  . ADD (attention deficit disorder)   . Allergy   . Asthma   . Cancer     testicular  . Depression   . Bipolar 1 disorder    Past Surgical History  Procedure Laterality Date  . Colon surgery     Family History  Problem Relation Age of Onset  . Asthma Other   . Hypertension Other   . Cancer Other    History  Substance Use Topics  . Smoking status: Never Smoker   . Smokeless tobacco: Not on file  . Alcohol Use: Yes     Comment: weekly (was a couple of beers daily)     Review of Systems  Constitutional: Positive for fatigue.  All other systems reviewed and are negative.     Allergies  Dust mite extract; Pollen extract; Bean pod extract; Other; Sesame oil; and Tomato  Home Medications   Prior to Admission medications   Medication Sig Start Date End Date Taking? Authorizing Provider  albuterol (PROVENTIL HFA;VENTOLIN HFA)  108 (90 BASE) MCG/ACT inhaler Inhale 2 puffs into the lungs every 6 (six) hours as needed.   Yes Historical Provider, MD  amphetamine-dextroamphetamine (ADDERALL) 30 MG tablet Take 15-30 mg by mouth 2 (two) times daily as needed (for aniexty).   Yes Historical Provider, MD  b complex vitamins tablet Take 1 tablet by mouth daily.   Yes Historical Provider, MD  ferrous sulfate 325 (65 FE) MG tablet Take 325 mg by mouth daily with breakfast.   Yes Historical Provider, MD  ibuprofen (ADVIL,MOTRIN) 200 MG tablet Take 400 mg by mouth every 6 (six) hours as needed for moderate pain.   Yes Historical Provider, MD  lamoTRIgine (LAMICTAL) 200 MG tablet Take 200 mg by mouth daily.   Yes Historical Provider, MD  LITHIUM PO Take 1 capsule by mouth 3 (three) times daily as needed (for aniexty). Has samples from Dr   Yes Historical Provider, MD   BP 142/85  Pulse 78  Temp(Src) 97.7 F (36.5 C) (Oral)  Resp 14  SpO2 100% Physical Exam  Constitutional: He is oriented to person, place, and time. He appears well-developed and well-nourished. No distress.  HENT:  Head: Normocephalic and atraumatic.  Right Ear: Hearing normal.  Left Ear: Hearing normal.  Nose: Nose normal.  Mouth/Throat: Oropharynx is clear and moist and mucous membranes are normal.  Eyes: Conjunctivae and EOM are normal. Pupils  are equal, round, and reactive to light.  Neck: Normal range of motion. Neck supple.  Cardiovascular: Regular rhythm, S1 normal and S2 normal.  Exam reveals no gallop and no friction rub.   No murmur heard. Pulmonary/Chest: Effort normal and breath sounds normal. No respiratory distress. He exhibits no tenderness.  Abdominal: Soft. Normal appearance and bowel sounds are normal. There is no hepatosplenomegaly. There is no tenderness. There is no rebound, no guarding, no tenderness at McBurney's point and negative Murphy's sign. No hernia.  Musculoskeletal: Normal range of motion.  Neurological: He is alert and  oriented to person, place, and time. He has normal strength. No cranial nerve deficit or sensory deficit. Coordination normal. GCS eye subscore is 4. GCS verbal subscore is 5. GCS motor subscore is 6.  Skin: Skin is warm, dry and intact. No rash noted. No cyanosis.  Psychiatric: He has a normal mood and affect. His speech is normal and behavior is normal. Thought content normal.    ED Course  Procedures (including critical care time) Labs Review Labs Reviewed  CBC - Abnormal; Notable for the following:    RBC 2.26 (*)    Hemoglobin 7.0 (*)    HCT 21.9 (*)    All other components within normal limits  COMPREHENSIVE METABOLIC PANEL - Abnormal; Notable for the following:    Potassium 6.2 (*)    Glucose, Bld 105 (*)    BUN 60 (*)    Creatinine, Ser 6.21 (*)    Total Bilirubin 0.2 (*)    GFR calc non Af Amer 9 (*)    GFR calc Af Amer 10 (*)    All other components within normal limits  URINALYSIS, ROUTINE W REFLEX MICROSCOPIC - Abnormal; Notable for the following:    APPearance CLOUDY (*)    Leukocytes, UA MODERATE (*)    All other components within normal limits  URINE MICROSCOPIC-ADD ON  POC OCCULT BLOOD, ED  TYPE AND SCREEN  ABO/RH    Imaging Review No results found.   EKG Interpretation   Date/Time:  Tuesday October 04 2013 12:06:04 EDT Ventricular Rate:  77 PR Interval:  184 QRS Duration: 91 QT Interval:  404 QTC Calculation: 457 R Axis:   69 Text Interpretation:  Sinus rhythm Normal ECG Confirmed by Mikaele Stecher  MD,  Carlyn Mullenbach ([id number]) on 10/04/2013 12:09:22 PM      MDM   Final diagnoses:  None   acute renal failure with hyperkalemia  Anemia  Patient referred to the ER for evaluation of acute renal failure and systematic anemia. Patient has been feeling very weak for the last couple of weeks. He was diagnosed with anemia, hemoglobin in the 8 by primary doctor. He was told to take iron in it was redrawn. He was found to have further progression of his anemia and  now is exhibiting signs of acute renal failure. He was referred to the ER. Patient is asymptomatic upon arrival. He is without complaints. Bufford confirms acute renal failure with a creatinine of 6.2. Patient's potassium is 6.2 as well. He does not have any significant EKG changes of hyperkalemia. Patient administered Kayexalate for mild hyperkalemia. Case discussed with Dr. Marval Regal who requests that the patient be admitted to [facility] in the event that he requires dialysis after transfusion of blood.  Rectal exam was heme-negative. The remainder of his examination is unremarkable. He will require further workup for this anemia. Will admit to hospitalist service.   Orpah Greek, MD 10/04/13 [phone]

## 2013-10-04 NOTE — Progress Notes (Signed)
16 foley cath placed per order,1500cc return  With  Small  Clot passed, patient tolerated procedure well.

## 2013-10-04 NOTE — Consult Note (Signed)
Reason for Consult:Elevated Cr  Referring Physician: Dr. Roselind Messier [name] is an 63 y.o. male.  HPI: 63 yr male with hx asthma, Bipolar Dz with primarily depression, ADD who presents with Cr 6.2 and K 6.2.  Has symptoms primarily of severe fatigue of 6-8 wk duration.  He had normal labs in 5/15.  Developed bad taste in mouth and thought was periodontal disease.  Was seen in [facility] ER on 8/5 and given Cleocin and thought to be anxious.  Has Hb of 8.5 at that time.  He was given Cleocin, developed D, and N, and took for several days.  After about 10d was given PCN instead.  After seen in [place], was seen by Psychiatrist and given Nicoletta Dress.  He took 3 caps initially and then 2-3 every 2-3 d, not reg.  Was taking Ibuprofen qd since early to mid July for HA, daily.   Notes bad taste in mouth, DOE, poor energy, little appetite, occ upper abdm pain.  In retrospect,some prob with voiding.  Has been on Lamictal also. No rash, arthralgias, sores in mouth eyes, or hands and feet.  No blood in urine and making urine but less.  Has nocturia x 1-2.  No FH of renal dz.  Father had Prostate Ca and died in his 98s, mother died in her 43s of MM.  He has no hx DM or HTN, no hx Hepatitis.  Uses alcohol 1-3 drinks/d, and smokes marijuana.   Found to have Hb of 7 at this time. 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 ADD (attention deficit disorder)   . Allergy   . Asthma   . Cancer     testicular  . Depression   . Bipolar 1 disorder     Past Surgical History  Procedure Laterality Date  . Colon surgery       Family History  Problem Relation Age of Onset  . Asthma Other   . Hypertension Other   . Cancer Other     Social History:  reports that he has never smoked. He has never used smokeless tobacco. He reports that he drinks alcohol. He reports that he uses illicit drugs (Marijuana).  Allergies:  Allergies  Allergen Reactions  . Dust Mite Extract Shortness Of Breath  . Pollen Extract Shortness Of Breath  . Bean Pod Extract Other (See Comments)    Eczema   . Other Other (See Comments)    Potato's - eczema   . Sesame Oil Other (See Comments)    Severe sweating   . Tomato Other (See Comments)    Eczema     Medications:  I have reviewed the patient's current medications. Prior to Admission:  Prescriptions prior to admission  Medication Sig Dispense Refill  . albuterol (PROVENTIL HFA;VENTOLIN HFA) 108 (90 BASE) MCG/ACT inhaler Inhale 2 puffs into the lungs every 6 (six) hours as needed.      Marland Kitchen amphetamine-dextroamphetamine (ADDERALL) 30 MG tablet Take 15-30 mg by mouth 2 (two) times daily as needed (for aniexty).      Marland Kitchen b complex vitamins tablet Take 1 tablet by mouth daily.      . ferrous sulfate 325 (65 FE) MG tablet  Take 325 mg by mouth daily with breakfast.      . ibuprofen (ADVIL,MOTRIN) 200 MG tablet Take 400 mg by mouth every 6 (six) hours as needed for moderate pain.      Marland Kitchen lamoTRIgine (LAMICTAL) 200 MG tablet Take 200 mg by mouth daily.      Marland Kitchen LITHIUM PO Take 1 capsule by mouth 3 (three) times daily as needed (for aniexty). Has samples from Dr        Results for orders placed during the hospital encounter of 10/04/13 (from the past 48 hour(s))  CBC     Status: Abnormal   Collection Time    10/04/13 12:09 PM      Result Value Ref Range   WBC 5.0  4.0 - 10.5 K/uL   RBC 2.26 (*) 4.22 - 5.81 MIL/uL   Hemoglobin 7.0 (*) 13.0 - 17.0 g/dL   HCT 21.9 (*) 39.0 - 52.0 %   MCV 96.9  78.0 - 100.0 fL   MCH 31.0  26.0 - 34.0 pg   MCHC 32.0  30.0 - 36.0 g/dL   RDW 12.2  11.5 -  15.5 %   Platelets 224  150 - 400 K/uL  COMPREHENSIVE METABOLIC PANEL     Status: Abnormal   Collection Time    10/04/13 12:09 PM      Result Value Ref Range   Sodium 141  137 - 147 mEq/L   Potassium 6.2 (*) 3.7 - 5.3 mEq/L   Chloride 107  96 - 112 mEq/L   CO2 21  19 - 32 mEq/L   Glucose, Bld 105 (*) 70 - 99 mg/dL   BUN 60 (*) 6 - 23 mg/dL   Creatinine, Ser 6.21 (*) 0.50 - 1.35 mg/dL   Calcium 9.6  8.4 - 10.5 mg/dL   Total Protein 8.2  6.0 - 8.3 g/dL   Albumin 3.9  3.5 - 5.2 g/dL   AST 15  0 - 37 U/L   ALT 13  0 - 53 U/L   Alkaline Phosphatase 63  39 - 117 U/L   Total Bilirubin 0.2 (*) 0.3 - 1.2 mg/dL   GFR calc non Af Amer 9 (*) >90 mL/min   GFR calc Af Amer 10 (*) >90 mL/min   Comment: (NOTE)     The eGFR has been calculated using the CKD EPI equation.     This calculation has not been validated in all clinical situations.     eGFR's persistently <90 mL/min signify possible Chronic Kidney     Disease.   Anion gap 13  5 - 15  TYPE AND SCREEN     Status: None   Collection Time    10/04/13 12:09 PM      Result Value Ref Range   ABO/RH(D) B NEG     Antibody Screen NEG     Sample Expiration 10/07/2013    ABO/RH     Status: None   Collection Time    10/04/13 12:09 PM      Result Value Ref Range   ABO/RH(D) B NEG    URINALYSIS, ROUTINE W REFLEX MICROSCOPIC     Status: Abnormal   Collection Time    10/04/13 12:37 PM      Result Value Ref Range   Color, Urine YELLOW  YELLOW   APPearance CLOUDY (*) CLEAR   Specific Gravity, Urine 1.012  1.005 - 1.030   pH 5.0  5.0 - 8.0   Glucose, UA NEGATIVE  NEGATIVE mg/dL  Hgb urine dipstick NEGATIVE  NEGATIVE   Bilirubin Urine NEGATIVE  NEGATIVE   Ketones, ur NEGATIVE  NEGATIVE mg/dL   Protein, ur NEGATIVE  NEGATIVE mg/dL   Urobilinogen, UA 0.2  0.0 - 1.0 mg/dL   Nitrite NEGATIVE  NEGATIVE   Leukocytes, UA MODERATE (*) NEGATIVE  URINE MICROSCOPIC-ADD ON     Status: None   Collection Time    10/04/13 12:37 PM      Result Value  Ref Range   Squamous Epithelial / LPF RARE  RARE   WBC, UA 7-10  <3 WBC/hpf   RBC / HPF 0-2  <3 RBC/hpf   Bacteria, UA RARE  RARE  POC OCCULT BLOOD, ED     Status: None   Collection Time    10/04/13 12:41 PM      Result Value Ref Range   Fecal Occult Bld NEGATIVE  NEGATIVE  LITHIUM LEVEL     Status: Abnormal   Collection Time    10/04/13  2:24 PM      Result Value Ref Range   Lithium Lvl <0.25 (*) 0.80 - 1.40 mEq/L  RETICULOCYTES     Status: Abnormal   Collection Time    10/04/13  2:52 PM      Result Value Ref Range   Retic Ct Pct 1.9  0.4 - 3.1 %   RBC. 2.25 (*) 4.22 - 5.81 MIL/uL   Retic Count, Manual 42.8  19.0 - 186.0 K/uL    No results found.  ROS Blood pressure 164/87, pulse 77, temperature 98 F (36.7 C), temperature source Oral, resp. rate 18, height 6' 3.5" (1.918 m), weight 79.8 kg (175 lb 14.8 oz), SpO2 100.00%. Physical Exam Physical Examination: General appearance - oriented to person, place, and time and chronically ill appearing Mental status - alert, oriented to person, place, and time Eyes - pupils equal and reactive, extraocular eye movements intact, funduscopic exam normal, discs flat and sharp Mouth - mucous membranes moist, pharynx normal without lesions Neck - adenopathy noted PCL Lymphatics - posterior cervical nodes Chest - clear to auscultation, no wheezes, rales or rhonchi, symmetric air entry Heart - S1 and S2 normal, systolic murmur ON6/2 at apex Abdomen - pos bs, liver down 4 cm, Palp lower abdm mass, rounded and tender, makes feel like voiding Musculoskeletal - no joint tenderness, deformity or swelling Extremities - peripheral pulses normal, no pedal edema, no clubbing or cyanosis Skin - dry  Assessment/Plan: 1 AKI more subacute picture.  Suspect 2 mon or so esp with anemia 3 wk ago.  Differential:  Obstruction esp with exam findings (worrisome with his hx Testic Ca), vol depletion with NSAID in system predisposing to injury, AIN from MEDs   (lamictal implic in higher incidence AKI), or other toxin, less likely subacute HUS (no signs thrombotic microangiopathy), or AGN assoc with periodontal dz (not likely) . Has ^ K, acidemia and uremia and if not better, will need dialysis soon. 2 ^ K tx with Kayex 3 Acidemia 4. Anemia suspect related to renal disase but consider blood loss , hemolysis 5. B[place]ola 6 ADD  P Foley, U/S, Urine Na and Cr. Eos, C3, C4,  LDH  Adam Herring L 10/04/2013, 6:53 PM

## 2013-10-04 NOTE — ED Notes (Signed)
md at bedside  Pt alert and oriented x4. Respirations even and unlabored, bilateral symmetrical rise and fall of chest. Skin warm and dry. In no acute distress. Denies needs.   

## 2013-10-04 NOTE — ED Notes (Signed)
Pt presents with c/o low hbg, pt's MD told him to come here after a result of 7 on his hbg.

## 2013-10-04 NOTE — ED Notes (Signed)
Dr Sherren Mocha called, sending pt, c/o weight loss, hgb 7, creatine 6. md suspects cancer, but no diagnosis.

## 2013-10-04 NOTE — ED Notes (Addendum)
Pt has been feeling lethargic x1 month, pt went to ED 3 weeks ago dx with peridontal issue. Went to dentist, started on penicillin. Followed up with pcp last Thursday, had blood work and stool tests. pcp called pt today and told him to come to ED. hgb 7. Creatinine 6. Pt reports abdominal problems x3 weeks of not being able to eat a full meal, stomach discomfort. Denies pain. Denies n/v. Reports some issues with diarrhea/ stool.  Reports weight loss. Reports he was suppose to have colonoscopy a few years ago, but missed it. Testicular cancer 10 years ago.

## 2013-10-04 NOTE — ED Notes (Signed)
Bed: WA21 Expected date:  Expected time:  Means of arrival:  Comments: 

## 2013-10-04 NOTE — H&P (Signed)
[facility] History and Physical  Seth Higginbotham Albo WUJ:[id number] DOB: 01/11/1951 DOA: 10/04/2013  Referring physician: Dr. Betsey Holiday PCP: Joycelyn Man, MD   Chief Complaint: Elevated creatinine  HPI: Adam Herring is a 63 y.o. male with past medical history of ADD, history of testicular cancer  and bipolar disorder. Patient called from his 33 office today to come to the ED for elevated creatinine and low hemoglobin. Patient reported that he's been not feeling well for the past 3-4 weeks, and to start when he had some tooth pain and diagnosed with periodontal disease at the same time he was feeling very fatigued and tired. Patient was placed on clindamycin initially and then switched to penicillin. Afterwards patient was placed on lithium at some point he sitting between 2-3 weeks ago. Patient denies taking more than prescribed lithium, denies taking excess ibuprofen. His creatinine around 6 and hemoglobin 7 so his PCP called him to go to the closest ER. In the ER creatinine 6.2, hemoglobin 7.0, potassium is 6.2, no microcytosis, stool is negative for occult blood. Patient will be admitted to the hospital for further evaluation.  Review of Systems:  Constitutional: Generalized fatigability Eyes: negative for irritation, redness and visual disturbance Ears, nose, mouth, throat, and face: negative for earaches, epistaxis, nasal congestion and sore throat Respiratory: negative for cough, dyspnea on exertion, sputum and wheezing Cardiovascular: negative for chest pain, dyspnea, lower extremity edema, orthopnea, palpitations and syncope Gastrointestinal: negative for abdominal pain, constipation, diarrhea, melena, nausea and vomiting Genitourinary:negative for dysuria, frequency and hematuria Hematologic/lymphatic: negative for bleeding, easy bruising and lymphadenopathy Musculoskeletal:negative for arthralgias, muscle weakness and stiff joints Neurological: negative for  coordination problems, gait problems, headaches and weakness Endocrine: negative for diabetic symptoms including polydipsia, polyuria and weight loss Allergic/Immunologic: negative for anaphylaxis, hay fever and urticaria  Past Medical History  Diagnosis Date  . ADD (attention deficit disorder)   . Allergy   . Asthma   . Cancer     testicular  . Depression   . Bipolar 1 disorder    Past Surgical History  Procedure Laterality Date  . Colon surgery     Social History:   reports that he has never smoked. He has never used smokeless tobacco. He reports that he drinks alcohol. He reports that he uses illicit drugs (Marijuana).  Allergies  Allergen Reactions  . Dust Mite Extract Shortness Of Breath  . Pollen Extract Shortness Of Breath  . Bean Pod Extract Other (See Comments)    Eczema   . Other Other (See Comments)    Potato's - eczema   . Sesame Oil Other (See Comments)    Severe sweating   . Tomato Other (See Comments)    Eczema     Family History  Problem Relation Age of Onset  . Asthma Other   . Hypertension Other   . Cancer Other      Prior to Admission medications   Medication Sig Start Date End Date Taking? Authorizing Provider  albuterol (PROVENTIL HFA;VENTOLIN HFA) 108 (90 BASE) MCG/ACT inhaler Inhale 2 puffs into the lungs every 6 (six) hours as needed.   Yes Historical Provider, MD  amphetamine-dextroamphetamine (ADDERALL) 30 MG tablet Take 15-30 mg by mouth 2 (two) times daily as needed (for aniexty).   Yes Historical Provider, MD  b complex vitamins tablet Take 1 tablet by mouth daily.   Yes Historical Provider, MD  ferrous sulfate 325 (65 FE) MG tablet Take 325 mg by mouth daily with breakfast.   Yes  Historical Provider, MD  ibuprofen (ADVIL,MOTRIN) 200 MG tablet Take 400 mg by mouth every 6 (six) hours as needed for moderate pain.   Yes Historical Provider, MD  lamoTRIgine (LAMICTAL) 200 MG tablet Take 200 mg by mouth daily.   Yes Historical Provider, MD    LITHIUM PO Take 1 capsule by mouth 3 (three) times daily as needed (for aniexty). Has samples from Dr   Yes Historical Provider, MD   Physical Exam: Filed Vitals:   10/04/13 1300  BP: 142/85  Pulse: 78  Temp:   Resp: 14   Constitutional: Oriented to person, place, and time. Well-developed and well-nourished. Cooperative.  Head: Normocephalic and atraumatic.  Nose: Nose normal.  Mouth/Throat: Uvula is midline, oropharynx is clear and moist and mucous membranes are normal.  Eyes: Conjunctivae and EOM are normal. Pupils are equal, round, and reactive to light.  Neck: Trachea normal and normal range of motion. Neck supple.  Cardiovascular: Normal rate, regular rhythm, S1 normal, S2 normal, normal heart sounds and intact distal pulses.   Pulmonary/Chest: Effort normal and breath sounds normal.  Abdominal: Soft. Bowel sounds are normal. There is no hepatosplenomegaly. There is no tenderness.  Musculoskeletal: Normal range of motion.  Neurological: Alert and oriented to person, place, and time. Has normal strength. No cranial nerve deficit or sensory deficit.  Skin: Skin is warm, dry and intact.  Psychiatric: Has a normal mood and affect. Speech is normal and behavior is normal.   Labs on Admission:  Basic Metabolic Panel:  Recent Labs Lab 09/29/13 1208 10/04/13 1209  NA 142 141  K 6.0* 6.2*  CL 112 107  CO2 21 21  GLUCOSE 99 105*  BUN 65* 60*  CREATININE 6.2* 6.21*  CALCIUM 9.6 9.6   Liver Function Tests:  Recent Labs Lab 09/29/13 1208 10/04/13 1209  AST 15 15  ALT 12 13  ALKPHOS 55 63  BILITOT 0.5 0.2*  PROT 7.8 8.2  ALBUMIN 4.0 3.9   No results found for this basename: LIPASE, AMYLASE,  in the last 168 hours No results found for this basename: AMMONIA,  in the last 168 hours CBC:  Recent Labs Lab 09/29/13 1208 10/04/13 1209  WBC 5.3 5.0  NEUTROABS 3.1  --   HGB 7.5 cL* 7.0*  HCT 22.6 aL* 21.9*  MCV 95.7 96.9  PLT 237.0 224   Cardiac Enzymes: No  results found for this basename: CKTOTAL, CKMB, CKMBINDEX, TROPONINI,  in the last 168 hours  BNP (last 3 results) No results found for this basename: PROBNP,  in the last 8760 hours CBG: No results found for this basename: GLUCAP,  in the last 168 hours  Radiological Exams on Admission: No results found.  EKG: Independently reviewed.   Assessment/Plan Principal Problem:   Acute renal failure Active Problems:   Bipolar depression   ADHD (attention deficit hyperactivity disorder)   History of colonic polyps   Anemia, unspecified    Acute renal failure -Unclear etiology, urinalysis showed no proteinuria. -Check lithium level, rule out NSAIDs nephropathy. -No gamma gap (total protein 8.2 and albumin is 3.4), defer checking SPEP. -Renal ultrasound, check urine sodium urine creatinine, might need to check urine eosinophils/renal biopsy. -Nephrology consulted await their evaluation.  Anemia -Of unclear etiology, normocytic anemia, could be related to his renal disease, OBT is negative. -Check anemia panel, transfuse one unit of packed RBCs.  Bipolar disorder/ADHD -Patient is on Adderall and lithium, reported that lithium started recently within 2-3 weeks. -Check lithium level, rule out lithium-induced  nephropathy.  History of colonic polyps -Patient has multiple colonic polyps in 2009 (no no tissue sent to pathology at that time) -His FOBT is negative, doubt this is related to the polyps.  Code Status: *Full code Family Communication: Plan discussed with the patient in the presence of his daughter at bedside Disposition Plan: Inpatient, telemetry  Time spent: 70 minutes  [facility] Pager [phone]

## 2013-10-04 NOTE — Progress Notes (Signed)
admit  To  Room 6e 06 From  Home  Via  ED @ [facility] Orientation  To  Room  Call bell  Safety plan Mental  Status  Alert and oriented  X 4

## 2013-10-04 NOTE — ED Notes (Signed)
[facility] called for transport. 

## 2013-10-05 ENCOUNTER — Inpatient Hospital Stay (HOSPITAL_COMMUNITY): Payer: BC Managed Care – PPO

## 2013-10-05 ENCOUNTER — Encounter (HOSPITAL_COMMUNITY): Payer: Self-pay | Admitting: Radiology

## 2013-10-05 LAB — COMPREHENSIVE METABOLIC PANEL
ALT: 8 U/L (ref 0–53)
AST: 14 U/L (ref 0–37)
Albumin: 3.6 g/dL (ref 3.5–5.2)
Alkaline Phosphatase: 57 U/L (ref 39–117)
Anion gap: 14 (ref 5–15)
BILIRUBIN TOTAL: 0.3 mg/dL (ref 0.3–1.2)
BUN: 56 mg/dL — AB (ref 6–23)
CHLORIDE: 106 meq/L (ref 96–112)
CO2: 20 mEq/L (ref 19–32)
Calcium: 9 mg/dL (ref 8.4–10.5)
Creatinine, Ser: 5.46 mg/dL — ABNORMAL HIGH (ref 0.50–1.35)
GFR, EST AFRICAN AMERICAN: 12 mL/min — AB (ref >90)
GFR, EST NON AFRICAN AMERICAN: 10 mL/min — AB (ref >90)
Glucose, Bld: 94 mg/dL (ref 70–99)
Potassium: 5.4 mEq/L — ABNORMAL HIGH (ref 3.7–5.3)
Sodium: 140 mEq/L (ref 137–147)
Total Protein: 7.2 g/dL (ref 6.0–8.3)

## 2013-10-05 LAB — CBC
HCT: 24.8 % — ABNORMAL LOW (ref 39.0–52.0)
Hemoglobin: 8.2 g/dL — ABNORMAL LOW (ref 13.0–17.0)
MCH: 31.3 pg (ref 26.0–34.0)
MCHC: 33.1 g/dL (ref 30.0–36.0)
MCV: 94.7 fL (ref 78.0–100.0)
PLATELETS: 221 10*3/uL (ref 150–400)
RBC: 2.62 MIL/uL — ABNORMAL LOW (ref 4.22–5.81)
RDW: 13 % (ref 11.5–15.5)
WBC: 6.8 10*3/uL (ref 4.0–10.5)

## 2013-10-05 LAB — PHOSPHORUS: Phosphorus: 5.2 mg/dL — ABNORMAL HIGH (ref 2.3–4.6)

## 2013-10-05 LAB — HEMOGLOBIN A1C
Hgb A1c MFr Bld: 5.3 % (ref <5.7)
Mean Plasma Glucose: 105 mg/dL (ref <117)

## 2013-10-05 MED ORDER — TAMSULOSIN HCL 0.4 MG PO CAPS
0.4000 mg | ORAL_CAPSULE | Freq: Every day | ORAL | Status: DC
Start: 2013-10-05 — End: 2013-10-14
  Administered 2013-10-05 – 2013-10-14 (×10): 0.4 mg via ORAL
  Filled 2013-10-05 (×10): qty 1

## 2013-10-05 NOTE — Progress Notes (Signed)
Patient ID: Adam Herring, male   DOB: 1951/01/29, 63 y.o.   MRN: [id number]  [facility] Progress Note    Assessment/ Plan:   1. AKI: Appears to be from bladder outlet obstruction and possibly drug-induced interstitial nephritis (given the presence of leukocytes in his urine). High urine sodium/FENa reflective of possible ATN. Serologies pending and will repeat CT scan of the abdomen another to evaluate for any further intra-abdominal pathology and his renal "mass". Improving renal function and good urine output noted overnight 2. Hyperkalemia: Secondary to acute renal failure possibly obstruction, continue to monitor with renal recovery. 3. Metabolic acidosis: From acute renal failure, continue to monitor with recovering renal function. 4. Anemia: No evidence of iron deficiency from recent labs, evaluate for possible plasma cell dyscrasia and other malignancies 5. Bipolar disorder/ADHD: Lithium levels undetectable/no evidence of toxicity  Subjective:   Reports to be well, anxious about bladder outlet obstruction and the implications of possible prostate cancer    Objective:   BP 159/84  Pulse 82  Temp(Src) 98.4 F (36.9 C) (Oral)  Resp 18  Ht 6' 3.5" (1.918 m)  Wt 79.8 kg (175 lb 14.8 oz)  BMI 21.69 kg/m2  SpO2 96%  Intake/Output Summary (Last 24 hours) at 10/05/13 7001 Last data filed at 10/05/13 0600  Gross per 24 hour  Intake 1228.75 ml  Output   3300 ml  Net -2071.25 ml   Weight change:   Physical Exam: Gen: Comfortably resting in bed CVS: Pulse regular in rate and rhythm, S1 and S2 normal Resp: Clear to auscultation bilaterally, no rales/rhonchi Abd: Soft, flat, nontender bowel sounds normal Ext: No lower extremity edema  Imaging: US Renal  10/04/2013   CLINICAL DATA:  Acute renal failure.  History of testicular cancer.  EXAM: RENAL/URINARY TRACT ULTRASOUND COMPLETE  COMPARISON:  None.  FINDINGS: Right Kidney:  Length: 11.0 cm. Echogenicity within  normal limits. Mild dilatation of the collecting system.  Left Kidney:  Length: 10.9 cm. Echogenicity within normal limits. No hydronephrosis visualized. 1.0 x 0.9 x 0.8 cm rounded, exophytic, hypoechoic mass arising from the midportion of the kidney.  Bladder:  Not visualized. Foley catheter in place. There is a 4.6 x 4.1 x 3.9 cm rounded mass like area in the expected position of the urinary bladder.  IMPRESSION: 1. Mild right hydronephrosis. 2. 1.0 cm probable mildly complex exophytic cyst arising from the midportion of the left kidney. A solid mass cannot be excluded. This could be better determined with an elective, outpatient pre and postcontrast magnetic resonance imaging examination of the kidneys. 3. Probable moderately enlarged prostate gland in the inferior pelvis. A bladder mass is less likely.   Electronically Signed   By: Enrique Sack M.D.   On: 10/04/2013 21:00    Labs: BMET  Recent Labs Lab 09/29/13 1208 10/04/13 1209 10/04/13 2215 10/05/13 0554  NA 142 141 142 140  K 6.0* 6.2* 5.7* 5.4*  CL 112 107 110 106  CO2 21 21 19 20   GLUCOSE 99 105* 132* 94  BUN 65* 60* 55* 56*  CREATININE 6.2* 6.21* 5.74* 5.46*  CALCIUM 9.6 9.6 9.5 9.0  PHOS  --   --  4.3 5.2*   CBC  Recent Labs Lab 09/29/13 1208 10/04/13 1209 10/05/13 0554  WBC 5.3 5.0 6.8  NEUTROABS 3.1  --   --   HGB 7.5 cL* 7.0* 8.2*  HCT 22.6 aL* 21.9* 24.8*  MCV 95.7 96.9 94.7  PLT 237.0 224 221  Medications:    . sodium chloride   Intravenous Once  . lamoTRIgine  200 mg Oral Daily  . sodium chloride  3 mL Intravenous Q12H  . sodium polystyrene  50 g Oral Once  . tamsulosin  0.4 mg Oral Daily   Elmarie Shiley, MD 10/05/2013, 9:34 AM

## 2013-10-05 NOTE — Consult Note (Signed)
Urology Consult   Physician requesting consult: Dr. Tana Coast Ripudeep  Consulting Physician: Dr. Bjorn Loser  Reason for consult: Bilateral hydronephrosis and elevated Cr  History of Present Illness: Adam Herring is a 63 y.o. male with urologic history significant for testicular cancer 12 years ago treated by orchiectomy and radiation admitted to the hospital for anemia and AKI discovered by his PCP on 8/20. His Cr was 6.2 and his Hgb was 7.5. He had been feeling poorly of late, including significant fatigue and anorexia and was recently treated with clindamycin then penicillin for tooth pain. He also had a "funny taste" in his mouth at that time.  A foley catheter was placed in the ER in the evening of 8/25 with 1528mL of output. At that time, he was having no urge to urinate. His Cr was 6.2 on admission. RUS shortly after foley placement revealed likely large intravesical prostate and a ~1cm exophytic "midly complex" left renal cyst. Mild right hydro was noted. Cr decreased to 5.4 overnight and CT non-con today revealed mild bilateral hydroureteronephrosis with a very enlarged prostate. Urology was consulted at this time to evaluate.  He has been on daily Ibuprofen for headaches for several months.   Endorses nocturia 1-2 times per night, although recently reports sleeping through the night without waking up to urinate. Has the urge to urinate and does so without difficulty 3-4 times per day with good stream. He had no indication that anything was wrong with his voiding function. Otherwise he denies a history of voiding or storage urinary symptoms, hematuria, UTIs, STDs, urolithiasis, GU trauma. He was previously using [place] but has not had a steady partner recently so it is difficult to assess his erectile function. Decreased libido.  PSA was stable between 2.6 and 3 from 2008 until 2013. It increased from 2.75 in 2.13 to 3.45 in 06/2012. Cr was 1.1 at that time.   Past Medical History   Diagnosis Date  . ADD (attention deficit disorder)   . Allergy   . Asthma   . Cancer     testicular  . Depression   . Bipolar 1 disorder     Past Surgical History  Procedure Laterality Date  . Colon surgery      Medications:  Home meds:    Medication List    ASK your doctor about these medications       albuterol 108 (90 BASE) MCG/ACT inhaler  Commonly known as:  PROVENTIL HFA;VENTOLIN HFA  Inhale 2 puffs into the lungs every 6 (six) hours as needed.     amphetamine-dextroamphetamine 30 MG tablet  Commonly known as:  ADDERALL  Take 15-30 mg by mouth 2 (two) times daily as needed (for aniexty).     b complex vitamins tablet  Take 1 tablet by mouth daily.     ferrous sulfate 325 (65 FE) MG tablet  Take 325 mg by mouth daily with breakfast.     ibuprofen 200 MG tablet  Commonly known as:  ADVIL,MOTRIN  Take 400 mg by mouth every 6 (six) hours as needed for moderate pain.     lamoTRIgine 200 MG tablet  Commonly known as:  LAMICTAL  Take 200 mg by mouth daily.     LITHIUM PO  Take 1 capsule by mouth 3 (three) times daily as needed (for aniexty). Has samples from Dr        Scheduled Meds: . sodium chloride   Intravenous Once  . lamoTRIgine  200 mg Oral Daily  .  sodium chloride  3 mL Intravenous Q12H  . sodium polystyrene  50 g Oral Once  . tamsulosin  0.4 mg Oral Daily   Continuous Infusions: . sodium chloride     PRN Meds:.acetaminophen, acetaminophen, albuterol, ondansetron (ZOFRAN) IV, ondansetron  Allergies:  Allergies  Allergen Reactions  . Dust Mite Extract Shortness Of Breath  . Pollen Extract Shortness Of Breath  . Bean Pod Extract Other (See Comments)    Eczema   . Other Other (See Comments)    Potato's - eczema   . Sesame Oil Other (See Comments)    Severe sweating   . Tomato Other (See Comments)    Eczema     Family History  Problem Relation Age of Onset  . Asthma Other   . Hypertension Other   . Cancer Other     Social  History:  reports that he has never smoked. He has never used smokeless tobacco. He reports that he drinks alcohol. He reports that he uses illicit drugs (Marijuana).  ROS: A complete review of systems was performed.  All systems are negative except for pertinent findings as noted.  Physical Exam:  Vital signs in last 24 hours: Temp:  [97.9 F (36.6 C)-99.1 F (37.3 C)] 97.9 F (36.6 C) (08/26 1000) Pulse Rate:  [77-87] 78 (08/26 1000) Resp:  [16-18] 18 (08/26 1000) BP: (128-164)/(66-95) 128/66 mmHg (08/26 1000) SpO2:  [96 %-100 %] 100 % (08/26 1000) Weight:  [79.8 kg (175 lb 14.8 oz)] 79.8 kg (175 lb 14.8 oz) (08/25 1800) Constitutional:  Alert and oriented, No acute distress Cardiovascular: Regular rate and rhythm, No JVD Respiratory: Normal respiratory effort, Lungs clear bilaterally GI: Abdomen is soft, nontender, nondistended, no abdominal masses Genitourinary: No CVAT. Normal circumcised male phallus, left testicle is surgically absent, right testicle is soft, very tender, and small (~13-15cc), scrotum is normal in appearance without lesions or masses. 16Fr foley in place with clear yellow/light pink urine. Rectal: Normal sphincter tone, no rectal masses, prostate is extremely enlarged but non tender and without nodularity or induration. Not fixed to side wall. Prostate size is estimated to be 80-100 cc. Could not feel base of prostate given the size. Lymphatic: No lymphadenopathy Neurologic: Grossly intact, no focal deficits Psychiatric: Normal mood and affect  Laboratory Data:   Recent Labs  10/04/13 1209 10/05/13 0554  WBC 5.0 6.8  HGB 7.0* 8.2*  HCT 21.9* 24.8*  PLT 224 221     Recent Labs  10/04/13 1209 10/04/13 2215 10/05/13 0554  NA 141 142 140  K 6.2* 5.7* 5.4*  CL 107 110 106  GLUCOSE 105* 132* 94  BUN 60* 55* 56*  CALCIUM 9.6 9.5 9.0  CREATININE 6.21* 5.74* 5.46*     Renal Function:  Recent Labs  09/29/13 1208 10/04/13 1209 10/04/13 2215  10/05/13 0554  CREATININE 6.2* 6.21* 5.74* 5.46*   Estimated Creatinine Clearance: 15.6 ml/min (by C-G formula based on Cr of 5.46).  UOP: 3.3L overnight, 2.3L in 12 hours today  Radiologic Imaging: Personally reviewed.  CT non-contrast 8/26 1. Unable to assess enhancement of left renal cyst without contrast. Likely cystic but will need further imaging. 2. Enlarged prostate indenting base of bladder.  3. Bilateral mild hydronephrosis/pelviectasis with bilateral hydroureter down to level of the bladder.   RUS 8/25 1. Mild right hydronephrosis 2. Left renal cyst 3. Mass in bladder, likely prostate. Thickened bladder wall.  I independently reviewed the above imaging studies.  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 1536mL in bladder when foley was placed. Unclear whether scarring from radiation is contributing to bilateral hydronephrosis, but this is much less likely to be a factor given degree of urinary retention and size of prostate.  1. Continue foley catheter and trend Cr. Would expect to improve, although may not return to normal if kidney function has declined due to chronic obstruction or if additional intrarenal cause (medication use, ATN, etc). Repeat renal ultrasound as an outpatient, likely next week. Would keep foley in until that point.  2. UCx to determine whether UTI may be precipitating factor in retention. 3. Start on finasteride if no interactions with current medication, though unlikely to be sufficient given large size of prostate. 4. Would not recommend a PSA in the setting of acute retention and foley catheter placement 5.Will require outpatient urology work up for BPH. Given degree of retention without discomfort, likely to have a component of  bladder dysfunction as a result of a chronically obstructed outlet that will need to be worked up as part of management plan for bladder outlet.  6. Consider checking testosterone as an outpatient given solitary, slightly atrophic testicle with ED and decreased libido.  Of note, the patient was planning on moving to [facility], [place] where he would like to have outpatient follow up Urologic down the road after this acute issue has resolved.  Patient discussed with Dr. Matilde Sprang  I performed a history and physical examination of the patient and discussed his management with the resident.  I reviewed the resident's note and agree with the documented findings and plan of care   PLEASE GET SERIAL LABS AND IF SERUM CR DOES NOT RETURN TO NEAR NORMAL HAVE NEPHROLOGY SEE HIM; STOP IBUPROPHEN AS POSSIBLE CULPRIT

## 2013-10-05 NOTE — Progress Notes (Signed)
Patient ID: Adam Herring  male  [id number]    DOB: November 21, 1950    DOA: 10/04/2013  PCP: Joycelyn Man, MD  Assessment/Plan: Principal Problem:   Acute renal failure: From bladder outlet obstruction or possibly drug-induced interstitial nephritis - Slightly improving, good urine output overnight - Renal consulted, recommended to continue IV fluids, CT of the abdomen and pelvis to evaluate for any other intra-abdominal pathology, exophytic cyst/mass  Active Problems: BPH - Will check PSA, (patient reports history of prostate cancer in his father) - Started on Flomax - And recommended outpatient urology followup    Bipolar depression/ADD - Patient is on Adderall and lithium, reported that lithium started recently within 2-3 weeks   Anemia, unspecified - Will check SPEP, UPEP - Hemoglobin 8.2, status post 1 unit packed RBC transfusion   DVT Prophylaxis:  Code Status:  Family Communication: Discussed with patient and his son at bedside  Disposition:  Consultants:  Nephrology  Procedures:  None  Antibiotics:  None    Subjective: Patient seen and examined, no abdominal pain, fevers or chills  Objective: Weight change:   Intake/Output Summary (Last 24 hours) at 10/05/13 1213 Last data filed at 10/05/13 0900  Gross per 24 hour  Intake 1468.75 ml  Output   3300 ml  Net -1831.25 ml   Blood pressure 128/66, pulse 78, temperature 97.9 F (36.6 C), temperature source Oral, resp. rate 18, height 6' 3.5" (1.918 m), weight 79.8 kg (175 lb 14.8 oz), SpO2 100.00%.  Physical Exam: General: Alert and awake, oriented x3, not in any acute distress. CVS: S1-S2 clear, no murmur rubs or gallops Chest: clear to auscultation bilaterally, no wheezing, rales or rhonchi Abdomen: soft nontender, nondistended, normal bowel sounds  Extremities: no cyanosis, clubbing or edema noted bilaterally Neuro: Cranial nerves II-XII intact, no focal neurological deficits  Lab  Results: Basic Metabolic Panel:  Recent Labs Lab 10/04/13 2215 10/05/13 0554  NA 142 140  K 5.7* 5.4*  CL 110 106  CO2 19 20  GLUCOSE 132* 94  BUN 55* 56*  CREATININE 5.74* 5.46*  CALCIUM 9.5 9.0  PHOS 4.3 5.2*   Liver Function Tests:  Recent Labs Lab 10/04/13 1209 10/04/13 2215 10/05/13 0554  AST 15  --  14  ALT 13  --  8  ALKPHOS 63  --  57  BILITOT 0.2*  --  0.3  PROT 8.2  --  7.2  ALBUMIN 3.9 3.6 3.6   No results found for this basename: LIPASE, AMYLASE,  in the last 168 hours No results found for this basename: AMMONIA,  in the last 168 hours CBC:  Recent Labs Lab 09/29/13 1208 10/04/13 1209 10/05/13 0554  WBC 5.3 5.0 6.8  NEUTROABS 3.1  --   --   HGB 7.5 cL* 7.0* 8.2*  HCT 22.6 aL* 21.9* 24.8*  MCV 95.7 96.9 94.7  PLT 237.0 224 221   Cardiac Enzymes: No results found for this basename: CKTOTAL, CKMB, CKMBINDEX, TROPONINI,  in the last 168 hours BNP: No components found with this basename: POCBNP,  CBG: No results found for this basename: GLUCAP,  in the last 168 hours   Micro Results: No results found for this or any previous visit (from the past 240 hour(s)).  Studies/Results: US Renal  10/04/2013   CLINICAL DATA:  Acute renal failure.  History of testicular cancer.  EXAM: RENAL/URINARY TRACT ULTRASOUND COMPLETE  COMPARISON:  None.  FINDINGS: Right Kidney:  Length: 11.0 cm. Echogenicity within normal limits. Mild dilatation of  the collecting system.  Left Kidney:  Length: 10.9 cm. Echogenicity within normal limits. No hydronephrosis visualized. 1.0 x 0.9 x 0.8 cm rounded, exophytic, hypoechoic mass arising from the midportion of the kidney.  Bladder:  Not visualized. Foley catheter in place. There is a 4.6 x 4.1 x 3.9 cm rounded mass like area in the expected position of the urinary bladder.  IMPRESSION: 1. Mild right hydronephrosis. 2. 1.0 cm probable mildly complex exophytic cyst arising from the midportion of the left kidney. A solid mass cannot  be excluded. This could be better determined with an elective, outpatient pre and postcontrast magnetic resonance imaging examination of the kidneys. 3. Probable moderately enlarged prostate gland in the inferior pelvis. A bladder mass is less likely.   Electronically Signed   By: Enrique Sack M.D.   On: 10/04/2013 21:00    Medications: Scheduled Meds: . sodium chloride   Intravenous Once  . lamoTRIgine  200 mg Oral Daily  . sodium chloride  3 mL Intravenous Q12H  . sodium polystyrene  50 g Oral Once  . tamsulosin  0.4 mg Oral Daily      LOS: 1 day   Finnley Lewis M.D. [facility] 10/05/2013, 12:13 PM Pager: [phone]  If 7PM-7AM, please contact night-coverage [email or web address] Password [email or web address]  **Disclaimer: This note was dictated with voice recognition software. Similar sounding words can inadvertently be transcribed and this note may contain transcription errors which may not have been corrected upon publication of note.**

## 2013-10-05 NOTE — Progress Notes (Signed)
Patient voiding bloody urine after voiding 1450 ml of clear yellow urine. NP on call notified, will continue to monitor.

## 2013-10-06 LAB — TYPE AND SCREEN
ABO/RH(D): B NEG
ANTIBODY SCREEN: NEGATIVE
UNIT DIVISION: 0
Unit division: 0

## 2013-10-06 LAB — BASIC METABOLIC PANEL
ANION GAP: 13 (ref 5–15)
BUN: 47 mg/dL — ABNORMAL HIGH (ref 6–23)
CHLORIDE: 108 meq/L (ref 96–112)
CO2: 20 mEq/L (ref 19–32)
CREATININE: 4.7 mg/dL — AB (ref 0.50–1.35)
Calcium: 8.8 mg/dL (ref 8.4–10.5)
GFR calc non Af Amer: 12 mL/min — ABNORMAL LOW (ref >90)
GFR, EST AFRICAN AMERICAN: 14 mL/min — AB (ref >90)
Glucose, Bld: 99 mg/dL (ref 70–99)
Potassium: 5.3 mEq/L (ref 3.7–5.3)
SODIUM: 141 meq/L (ref 137–147)

## 2013-10-06 LAB — PSA: PSA: 5.76 ng/mL — ABNORMAL HIGH (ref <=4.00)

## 2013-10-06 LAB — PROTEIN / CREATININE RATIO, URINE
Creatinine, Urine: 59.08 mg/dL
Protein Creatinine Ratio: 2.42 — ABNORMAL HIGH (ref 0.00–0.15)
TOTAL PROTEIN, URINE: 143.1 mg/dL

## 2013-10-06 LAB — HEPATITIS PANEL, ACUTE
HCV AB: NEGATIVE
HEP A IGM: NONREACTIVE
HEP B S AG: NEGATIVE
Hep B C IgM: NONREACTIVE

## 2013-10-06 LAB — HIV ANTIBODY (ROUTINE TESTING W REFLEX): HIV 1&2 Ab, 4th Generation: NONREACTIVE

## 2013-10-06 MED ORDER — POLYETHYLENE GLYCOL 3350 17 G PO PACK
17.0000 g | PACK | Freq: Every day | ORAL | Status: DC | PRN
  Filled 2013-10-06: qty 1

## 2013-10-06 MED ORDER — DOCUSATE SODIUM 100 MG PO CAPS
100.0000 mg | ORAL_CAPSULE | Freq: Two times a day (BID) | ORAL | Status: DC
  Administered 2013-10-06 – 2013-10-14 (×17): 100 mg via ORAL
  Filled 2013-10-06 (×20): qty 1

## 2013-10-06 NOTE — Progress Notes (Signed)
Patient ID: Adam Herring  male  ZOX:[id number]    DOB: Dec 19, 1950    DOA: 10/04/2013  PCP: Joycelyn Man, MD  Assessment/Plan: Principal Problem:   Acute renal failure: From bladder outlet obstruction or possibly drug-induced interstitial nephritis - Slightly improving, good urine output overnight, neg 4.1 liters - CT of the abdomen and pelvis showed mild bilateral hydronephrosis and hydroureter, enlarged prostate gland with indentation of the bladder base - Urology was consulted, continue Flomax, Foley catheter, follow urine culture - Outpatient workup for BPH by urology - SPEP UPEP in process  Active Problems: BPH - PSA slightly elevated, 5.76, urology consulted however PSA inconclusive due to Foley catheterization, outpatient further workup - Started on Flomax    Bipolar depression/ADD - Patient is on Adderall and lithium, reported that lithium started recently within 2-3 weeks   Anemia, unspecified - Hemoglobin 8.2, status post 1 unit packed RBC transfusion   DVT Prophylaxis: SCDs  Code Status:  Family Communication: Discussed with patient   Disposition:  Consultants:  Nephrology  Urology  Procedures:  None  Antibiotics:  None    Subjective: Patient seen and examined, no abdominal pain, fevers or chills, good urine output, some constipation  Objective: Weight change:   Intake/Output Summary (Last 24 hours) at 10/06/13 1256 Last data filed at 10/06/13 0934  Gross per 24 hour  Intake 1671.67 ml  Output   2350 ml  Net -678.33 ml   Blood pressure 117/66, pulse 90, temperature 98 F (36.7 C), temperature source Oral, resp. rate 18, height 6' 3.5" (1.918 m), weight 79.8 kg (175 lb 14.8 oz), SpO2 100.00%.  Physical Exam: General: Alert and awake, oriented x3, not in any acute distress. CVS: S1-S2 clear, no murmur rubs or gallops Chest: clear to auscultation bilaterally, no wheezing, rales or rhonchi Abdomen: soft nontender, nondistended,  normal bowel sounds  Extremities: no cyanosis, clubbing or edema noted bilaterally   Lab Results: Basic Metabolic Panel:  Recent Labs Lab 10/05/13 0554 10/06/13 0853  NA 140 141  K 5.4* 5.3  CL 106 108  CO2 20 20  GLUCOSE 94 99  BUN 56* 47*  CREATININE 5.46* 4.70*  CALCIUM 9.0 8.8  PHOS 5.2*  --    Liver Function Tests:  Recent Labs Lab 10/04/13 1209 10/04/13 2215 10/05/13 0554  AST 15  --  14  ALT 13  --  8  ALKPHOS 63  --  57  BILITOT 0.2*  --  0.3  PROT 8.2  --  7.2  ALBUMIN 3.9 3.6 3.6   No results found for this basename: LIPASE, AMYLASE,  in the last 168 hours No results found for this basename: AMMONIA,  in the last 168 hours CBC:  Recent Labs Lab 10/04/13 1209 10/05/13 0554  WBC 5.0 6.8  HGB 7.0* 8.2*  HCT 21.9* 24.8*  MCV 96.9 94.7  PLT 224 221   Cardiac Enzymes: No results found for this basename: CKTOTAL, CKMB, CKMBINDEX, TROPONINI,  in the last 168 hours BNP: No components found with this basename: POCBNP,  CBG: No results found for this basename: GLUCAP,  in the last 168 hours   Micro Results: No results found for this or any previous visit (from the past 240 hour(s)).  Studies/Results: US Renal  10/04/2013   CLINICAL DATA:  Acute renal failure.  History of testicular cancer.  EXAM: RENAL/URINARY TRACT ULTRASOUND COMPLETE  COMPARISON:  None.  FINDINGS: Right Kidney:  Length: 11.0 cm. Echogenicity within normal limits. Mild dilatation of the collecting  system.  Left Kidney:  Length: 10.9 cm. Echogenicity within normal limits. No hydronephrosis visualized. 1.0 x 0.9 x 0.8 cm rounded, exophytic, hypoechoic mass arising from the midportion of the kidney.  Bladder:  Not visualized. Foley catheter in place. There is a 4.6 x 4.1 x 3.9 cm rounded mass like area in the expected position of the urinary bladder.  IMPRESSION: 1. Mild right hydronephrosis. 2. 1.0 cm probable mildly complex exophytic cyst arising from the midportion of the left kidney. A  solid mass cannot be excluded. This could be better determined with an elective, outpatient pre and postcontrast magnetic resonance imaging examination of the kidneys. 3. Probable moderately enlarged prostate gland in the inferior pelvis. A bladder mass is less likely.   Electronically Signed   By: Enrique Sack M.D.   On: 10/04/2013 21:00    Medications: Scheduled Meds: . sodium chloride   Intravenous Once  . docusate sodium  100 mg Oral BID  . lamoTRIgine  200 mg Oral Daily  . sodium chloride  3 mL Intravenous Q12H  . sodium polystyrene  50 g Oral Once  . tamsulosin  0.4 mg Oral Daily      LOS: 2 days   Cloteal Isaacson M.D. [facility] 10/06/2013, 12:56 PM Pager: [phone]  If 7PM-7AM, please contact night-coverage [email or web address] Password [email or web address]  **Disclaimer: This note was dictated with voice recognition software. Similar sounding words can inadvertently be transcribed and this note may contain transcription errors which may not have been corrected upon publication of note.**

## 2013-10-06 NOTE — Progress Notes (Signed)
S:appetite improved.  Passed hard stool and would like stool softener.  O:BP 131/76  Pulse 87  Temp(Src) 98.6 F (37 C) (Oral)  Resp 18  Ht 6' 3.5" (1.918 m)  Wt 79.8 kg (175 lb 14.8 oz)  BMI 21.69 kg/m2  SpO2 100%  Intake/Output Summary (Last 24 hours) at 10/06/13 0849 Last data filed at 10/06/13 0501  Gross per 24 hour  Intake 1891.67 ml  Output   3300 ml  Net -1408.33 ml   Intake/Output: I/O last 3 completed shifts: In: 3120.4 [P.O.:1780; I.V.:1340.4] Out: 6850 [Urine:6850]  Intake/Output this shift:    Weight change:  Gen: sitting up in bed in NAD CVS: RRR, no m/r/g Resp: Clear to auscultation bilaterally, no rales/rhonchi  Abd: +BS, soft, right sided TTP, no distention/rebound or guarding Ext: No lower extremity edema   Recent Labs Lab 09/29/13 1208 10/04/13 1209 10/04/13 2215 10/05/13 0554  NA 142 141 142 140  K 6.0* 6.2* 5.7* 5.4*  CL 112 107 110 106  CO2 21 21 19 20   GLUCOSE 99 105* 132* 94  BUN 65* 60* 55* 56*  CREATININE 6.2* 6.21* 5.74* 5.46*  ALBUMIN 4.0 3.9 3.6 3.6  CALCIUM 9.6 9.6 9.5 9.0  PHOS  --   --  4.3 5.2*  AST 15 15  --  14  ALT 12 13  --  8   Liver Function Tests:  Recent Labs Lab 09/29/13 1208 10/04/13 1209 10/04/13 2215 10/05/13 0554  AST 15 15  --  14  ALT 12 13  --  8  ALKPHOS 55 63  --  57  BILITOT 0.5 0.2*  --  0.3  PROT 7.8 8.2  --  7.2  ALBUMIN 4.0 3.9 3.6 3.6   No results found for this basename: LIPASE, AMYLASE,  in the last 168 hours No results found for this basename: AMMONIA,  in the last 168 hours CBC:  Recent Labs Lab 09/29/13 1208 10/04/13 1209 10/05/13 0554  WBC 5.3 5.0 6.8  NEUTROABS 3.1  --   --   HGB 7.5 cL* 7.0* 8.2*  HCT 22.6 aL* 21.9* 24.8*  MCV 95.7 96.9 94.7  PLT 237.0 224 221   Cardiac Enzymes: No results found for this basename: CKTOTAL, CKMB, CKMBINDEX, TROPONINI,  in the last 168 hours CBG: No results found for this basename: GLUCAP,  in the last 168 hours  Iron Studies:  Recent  Labs  10/04/13 1452  IRON 90  TIBC 200*  FERRITIN 486*   Studies/Results: Ct Abdomen Pelvis Wo Contrast  10/05/2013   CLINICAL DATA:  Hydronephrosis, renal mass  EXAM: CT ABDOMEN AND PELVIS WITHOUT CONTRAST  TECHNIQUE: Multidetector CT imaging of the abdomen and pelvis was performed following the standard protocol without IV contrast.  COMPARISON:  Renal ultrasound 10/04/2013  FINDINGS: Sagittal images of the spine are unremarkable.  Unenhanced liver shows no biliary ductal dilatation. Gallbladder is contracted without evidence of calcified gallstones.  The study is limited without IV contrast. The pancreas spleen and adrenals are unremarkable. There is bilateral mild hydronephrosis and mild hydroureter. No calcified renal or ureteral calculi are identified. There is a exophytic probable cyst in midpole of the left kidney measures 9.4 mm and 10.3 Hounsfield units in attenuation.  Moderate colonic stool.  No small bowel obstruction. No ascites or free air. No adenopathy. There is a enlarged prostate gland with indentation of urinary bladder base. Prostate gland measures 5.9 x 4.7 cm. A Foley catheter is noted within a decompressed urinary bladder. Small  amount of air within bladder is probable post instrumentation. There is significant thickening of urinary bladder wall. This may be due to chronic inflammation or cystitis. Clinical correlation is necessary. No calcified calculi are noted within depressed urinary bladder.  IMPRESSION: 1. There is mild bilateral hydronephrosis and hydroureter. Probable cyst in midpole of the left kidney measures 9.4 mm. 2. No nephrolithiasis.  No calcified ureteral calculi. 3. Moderate colonic stool. 4. No small bowel or colonic obstruction. 5. There is enlarged prostate gland with indentation of the bladder base. Significant thickening of urinary bladder wall. This may be due to cystitis or chronic inflammation. Neoplastic process cannot be excluded. Correlation with urology  exam is recommended.   Electronically Signed   By: Lahoma Crocker M.D.   On: 10/05/2013 13:37   US Renal  10/04/2013   CLINICAL DATA:  Acute renal failure.  History of testicular cancer.  EXAM: RENAL/URINARY TRACT ULTRASOUND COMPLETE  COMPARISON:  None.  FINDINGS: Right Kidney:  Length: 11.0 cm. Echogenicity within normal limits. Mild dilatation of the collecting system.  Left Kidney:  Length: 10.9 cm. Echogenicity within normal limits. No hydronephrosis visualized. 1.0 x 0.9 x 0.8 cm rounded, exophytic, hypoechoic mass arising from the midportion of the kidney.  Bladder:  Not visualized. Foley catheter in place. There is a 4.6 x 4.1 x 3.9 cm rounded mass like area in the expected position of the urinary bladder.  IMPRESSION: 1. Mild right hydronephrosis. 2. 1.0 cm probable mildly complex exophytic cyst arising from the midportion of the left kidney. A solid mass cannot be excluded. This could be better determined with an elective, outpatient pre and postcontrast magnetic resonance imaging examination of the kidneys. 3. Probable moderately enlarged prostate gland in the inferior pelvis. A bladder mass is less likely.   Electronically Signed   By: Enrique Sack M.D.   On: 10/04/2013 21:00   . sodium chloride   Intravenous Once  . lamoTRIgine  200 mg Oral Daily  . sodium chloride  3 mL Intravenous Q12H  . sodium polystyrene  50 g Oral Once  . tamsulosin  0.4 mg Oral Daily    BMET    Component Value Date/Time   NA 140 10/05/2013 0554   K 5.4* 10/05/2013 0554   CL 106 10/05/2013 0554   CO2 20 10/05/2013 0554   GLUCOSE 94 10/05/2013 0554   BUN 56* 10/05/2013 0554   CREATININE 5.46* 10/05/2013 0554   CALCIUM 9.0 10/05/2013 0554   GFRNONAA 10* 10/05/2013 0554   GFRAA 12* 10/05/2013 0554   CBC    Component Value Date/Time   WBC 6.8 10/05/2013 0554   RBC 2.62* 10/05/2013 0554   RBC 2.25* 10/04/2013 1452   HGB 8.2* 10/05/2013 0554   HCT 24.8* 10/05/2013 0554   PLT 221 10/05/2013 0554   MCV 94.7 10/05/2013 0554    MCH 31.3 10/05/2013 0554   MCHC 33.1 10/05/2013 0554   RDW 13.0 10/05/2013 0554   LYMPHSABS 1.4 09/29/2013 1208   MONOABS 0.5 09/29/2013 1208   EOSABS 0.3 09/29/2013 1208   BASOSABS 0.0 09/29/2013 1208   Lab Results  Component Value Date   CREATININE 5.46* 10/05/2013   CREATININE 5.74* 10/04/2013   CREATININE 6.21* 10/04/2013    Assessment/Plan:  1. 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  Patient found to have urinary retention and yielded 2L of urine after catheter placement.  CT revealed mild B/L hydronephrosis and hydroureter, enlarged prostate gland, thickening of bladder wall (possibly 2/2  to cystitis or chronic inflammation, can't exclude malignancy).  Substantial UOP  - 3.550L yesterday.  AM labs pending.       - continue IV fluids       - continue foley        - Urology following, will need outpatient f/u 2. Hyperkalemia - due to AKI 3. Metabolic acidosis - due to AKI 4. Anemia - baseline hgb is 12-13, however was 7.5 last week (prior to admission). Hgb 7.0 this admission.  Labs c/w anemia of chronic disease/inflammation.  He has family hx of prostate Ca and MM.       - SPEP, urine IFE, urine cytology pending 5. Bipolar disorder/ADHD - no evidence of lithium toxicity (Li level is < 0.25).  Duwaine Maxin, DO [place], PGY2 [phone] 10/06/13 08:49AM

## 2013-10-06 NOTE — Progress Notes (Signed)
No pain Follow labs See consult note IN BOLD Will follow My phone number given

## 2013-10-06 NOTE — Progress Notes (Signed)
I have personally seen and examined this patient and agree with the assessment/plan as outlined above by Redmond Pulling DO (PGY2). Awaiting labs for AKI evaluation. Urology note reviewed. Send urine p/c ratio with hepatitis and HIV testing. Jasmaine Rochel K.,MD 10/06/2013 10:35 AM

## 2013-10-07 LAB — BASIC METABOLIC PANEL
Anion gap: 13 (ref 5–15)
BUN: 44 mg/dL — ABNORMAL HIGH (ref 6–23)
CALCIUM: 9.1 mg/dL (ref 8.4–10.5)
CO2: 20 mEq/L (ref 19–32)
CREATININE: 4.34 mg/dL — AB (ref 0.50–1.35)
Chloride: 106 mEq/L (ref 96–112)
GFR calc Af Amer: 15 mL/min — ABNORMAL LOW (ref >90)
GFR calc non Af Amer: 13 mL/min — ABNORMAL LOW (ref >90)
GLUCOSE: 113 mg/dL — AB (ref 70–99)
Potassium: 5.4 mEq/L — ABNORMAL HIGH (ref 3.7–5.3)
Sodium: 139 mEq/L (ref 137–147)

## 2013-10-07 LAB — PROTEIN ELECTROPHORESIS, SERUM
Albumin ELP: 51.8 % — ABNORMAL LOW (ref 55.8–66.1)
Alpha-1-Globulin: 5 % — ABNORMAL HIGH (ref 2.9–4.9)
Alpha-2-Globulin: 9 % (ref 7.1–11.8)
BETA GLOBULIN: 5.8 % (ref 4.7–7.2)
Beta 2: 6.2 % (ref 3.2–6.5)
Gamma Globulin: 22.2 % — ABNORMAL HIGH (ref 11.1–18.8)
M-SPIKE, %: 0.63 g/dL
TOTAL PROTEIN ELP: 6.9 g/dL (ref 6.0–8.3)

## 2013-10-07 LAB — URINE CULTURE
CULTURE: NO GROWTH
Colony Count: NO GROWTH

## 2013-10-07 MED ORDER — SODIUM POLYSTYRENE SULFONATE 15 GM/60ML PO SUSP
30.0000 g | Freq: Once | ORAL | Status: AC
  Administered 2013-10-07: 30 g via ORAL
  Filled 2013-10-07: qty 120

## 2013-10-07 NOTE — Progress Notes (Signed)
Patient ID: Adam Herring  male  WNI:[id number]    DOB: [birth year]    DOA: 10/04/2013  PCP: Joycelyn Man, MD  Assessment/Plan: Principal Problem:   Acute renal failure: From bladder outlet obstruction or possibly drug-induced interstitial nephritis from NSAIDS   - Slightly improving, Cr 4.3 good urine output overnight, neg 7.2 liters, Polyuria - CT of the abdomen and pelvis showed mild bilateral hydronephrosis and hydroureter, enlarged prostate gland with indentation of the bladder base - Urology was consulted, continue Flomax, Foley catheter, follow urine culture - Outpatient workup for BPH by urology - SPEP UPEP in process, renal following  Active Problems: Hyperkalemia - gave kayexalate   BPH - PSA slightly elevated, 5.76, urology consulted however PSA inconclusive due to Foley catheterization, outpatient further workup - Started on Flomax    Bipolar depression/ADD - Patient is on Adderall and lithium, reported that lithium started recently within 2-3 weeks   Anemia, unspecified - Hemoglobin stable   DVT Prophylaxis: SCDs  Code Status:  Family Communication: Discussed with patient   Disposition:  Consultants:  Nephrology  Urology  Procedures:  None  Antibiotics:  None    Subjective: Patient seen and examined, no complaints  Objective: Weight change:   Intake/Output Summary (Last 24 hours) at 10/07/13 1121 Last data filed at 10/07/13 0624  Gross per 24 hour  Intake    480 ml  Output   3550 ml  Net  -3070 ml   Blood pressure 119/78, pulse 88, temperature 98.4 F (36.9 C), temperature source Oral, resp. rate 18, height 6' 3.5" (1.918 m), weight 79.8 kg (175 lb 14.8 oz), SpO2 99.00%.  Physical Exam: General: Alert and awake, oriented x3, not in any acute distress. CVS: S1-S2 clear Chest: CTAB Abdomen: soft nontender, nondistended, normal bowel sounds  Extremities: no c/c/e bilaterally   Lab Results: Basic Metabolic Panel:  Recent  Labs Lab 10/05/13 0554 10/06/13 0853 10/07/13 0535  NA 140 141 139  K 5.4* 5.3 5.4*  CL 106 108 106  CO2 20 20 20   GLUCOSE 94 99 113*  BUN 56* 47* 44*  CREATININE 5.46* 4.70* 4.34*  CALCIUM 9.0 8.8 9.1  PHOS 5.2*  --   --    Liver Function Tests:  Recent Labs Lab 10/04/13 1209 10/04/13 2215 10/05/13 0554  AST 15  --  14  ALT 13  --  8  ALKPHOS 63  --  57  BILITOT 0.2*  --  0.3  PROT 8.2  --  7.2  ALBUMIN 3.9 3.6 3.6   No results found for this basename: LIPASE, AMYLASE,  in the last 168 hours No results found for this basename: AMMONIA,  in the last 168 hours CBC:  Recent Labs Lab 10/04/13 1209 10/05/13 0554  WBC 5.0 6.8  HGB 7.0* 8.2*  HCT 21.9* 24.8*  MCV 96.9 94.7  PLT 224 221   Cardiac Enzymes: No results found for this basename: CKTOTAL, CKMB, CKMBINDEX, TROPONINI,  in the last 168 hours BNP: No components found with this basename: POCBNP,  CBG: No results found for this basename: GLUCAP,  in the last 168 hours   Micro Results: No results found for this or any previous visit (from the past 240 hour(s)).  Studies/Results: US Renal  10/04/2013   CLINICAL DATA:  Acute renal failure.  History of testicular cancer.  EXAM: RENAL/URINARY TRACT ULTRASOUND COMPLETE  COMPARISON:  None.  FINDINGS: Right Kidney:  Length: 11.0 cm. Echogenicity within normal limits. Mild dilatation of the collecting system.  Left Kidney:  Length: 10.9 cm. Echogenicity within normal limits. No hydronephrosis visualized. 1.0 x 0.9 x 0.8 cm rounded, exophytic, hypoechoic mass arising from the midportion of the kidney.  Bladder:  Not visualized. Foley catheter in place. There is a 4.6 x 4.1 x 3.9 cm rounded mass like area in the expected position of the urinary bladder.  IMPRESSION: 1. Mild right hydronephrosis. 2. 1.0 cm probable mildly complex exophytic cyst arising from the midportion of the left kidney. A solid mass cannot be excluded. This could be better determined with an elective,  outpatient pre and postcontrast magnetic resonance imaging examination of the kidneys. 3. Probable moderately enlarged prostate gland in the inferior pelvis. A bladder mass is less likely.   Electronically Signed   By: Enrique Sack M.D.   On: 10/04/2013 21:00    Medications: Scheduled Meds: . sodium chloride   Intravenous Once  . docusate sodium  100 mg Oral BID  . lamoTRIgine  200 mg Oral Daily  . sodium chloride  3 mL Intravenous Q12H  . sodium polystyrene  50 g Oral Once  . tamsulosin  0.4 mg Oral Daily      LOS: 3 days   RAI,RIPUDEEP M.D. [facility] 10/07/2013, 11:21 AM Pager: [phone]  If 7PM-7AM, please contact night-coverage [email or web address] Password [email or web address]  **Disclaimer: This note was dictated with voice recognition software. Similar sounding words can inadvertently be transcribed and this note may contain transcription errors which may not have been corrected upon publication of note.**

## 2013-10-07 NOTE — Progress Notes (Signed)
Patient c/o clots in the foley drainage bag, nurse checked and paged Dr. Tana Coast, got a new order to irrigate foley, nurse irrigated foley and patient tolerated well.  Foley draining red color urine.

## 2013-10-07 NOTE — Progress Notes (Signed)
I have personally seen and examined this patient and agree with the assessment/plan as outlined above by Redmond Pulling DO (PGY2). Slowly improving renal function (ATN vs NSAID nephropathy vs obstruction)---continue to monitor off IVFs at this time. Sub-nephrotic proteinuria and SPEP pending. S/p SPE by [facility] earlier. Polyuric.   Radley Barto K.,MD 10/07/2013 11:02 AM

## 2013-10-07 NOTE — Progress Notes (Signed)
S:Doing ok.  Good UOP. Appetite improved. O:BP 119/78  Pulse 88  Temp(Src) 98.4 F (36.9 C) (Oral)  Resp 18  Ht 6' 3.5" (1.918 m)  Wt 79.8 kg (175 lb 14.8 oz)  BMI 21.69 kg/m2  SpO2 99%  Intake/Output Summary (Last 24 hours) at 10/07/13 0825 Last data filed at 10/07/13 5809  Gross per 24 hour  Intake    720 ml  Output   4200 ml  Net  -3480 ml   Intake/Output: I/O last 3 completed shifts: In: 1791.7 [P.O.:1440; I.V.:351.7] Out: 9833 [Urine:5450]  Intake/Output this shift:    Weight change:  Gen: sitting up in bed in NAD  CVS: RRR, no m/r/g  Resp: Clear to auscultation bilaterally, no rales/rhonchi  Abd: +BS, soft, suprapubic tenderness, no distention/rebound or guarding, no CVAT GU:  600cc dark yellow/orange urine in foley bag   Recent Labs Lab 10/04/13 1209 10/04/13 2215 10/05/13 0554 10/06/13 0853 10/07/13 0535  NA 141 142 140 141 139  K 6.2* 5.7* 5.4* 5.3 5.4*  CL 107 110 106 108 106  CO2 21 19 20 20 20   GLUCOSE 105* 132* 94 99 113*  BUN 60* 55* 56* 47* 44*  CREATININE 6.21* 5.74* 5.46* 4.70* 4.34*  ALBUMIN 3.9 3.6 3.6  --   --   CALCIUM 9.6 9.5 9.0 8.8 9.1  PHOS  --  4.3 5.2*  --   --   AST 15  --  14  --   --   ALT 13  --  8  --   --    Liver Function Tests:  Recent Labs Lab 10/04/13 1209 10/04/13 2215 10/05/13 0554  AST 15  --  14  ALT 13  --  8  ALKPHOS 63  --  57  BILITOT 0.2*  --  0.3  PROT 8.2  --  7.2  ALBUMIN 3.9 3.6 3.6   CBC:  Recent Labs Lab 10/04/13 1209 10/05/13 0554  WBC 5.0 6.8  HGB 7.0* 8.2*  HCT 21.9* 24.8*  MCV 96.9 94.7  PLT 224 221   Iron Studies:  Recent Labs  10/04/13 1452  IRON 90  TIBC 200*  FERRITIN 486*   Studies/Results: Ct Abdomen Pelvis Wo Contrast  10/05/2013   CLINICAL DATA:  Hydronephrosis, renal mass  EXAM: CT ABDOMEN AND PELVIS WITHOUT CONTRAST  TECHNIQUE: Multidetector CT imaging of the abdomen and pelvis was performed following the standard protocol without IV contrast.  COMPARISON:  Renal  ultrasound 10/04/2013  FINDINGS: Sagittal images of the spine are unremarkable.  Unenhanced liver shows no biliary ductal dilatation. Gallbladder is contracted without evidence of calcified gallstones.  The study is limited without IV contrast. The pancreas spleen and adrenals are unremarkable. There is bilateral mild hydronephrosis and mild hydroureter. No calcified renal or ureteral calculi are identified. There is a exophytic probable cyst in midpole of the left kidney measures 9.4 mm and 10.3 Hounsfield units in attenuation.  Moderate colonic stool.  No small bowel obstruction. No ascites or free air. No adenopathy. There is a enlarged prostate gland with indentation of urinary bladder base. Prostate gland measures 5.9 x 4.7 cm. A Foley catheter is noted within a decompressed urinary bladder. Small amount of air within bladder is probable post instrumentation. There is significant thickening of urinary bladder wall. This may be due to chronic inflammation or cystitis. Clinical correlation is necessary. No calcified calculi are noted within depressed urinary bladder.  IMPRESSION: 1. There is mild bilateral hydronephrosis and hydroureter. Probable cyst in  midpole of the left kidney measures 9.4 mm. 2. No nephrolithiasis.  No calcified ureteral calculi. 3. Moderate colonic stool. 4. No small bowel or colonic obstruction. 5. There is enlarged prostate gland with indentation of the bladder base. Significant thickening of urinary bladder wall. This may be due to cystitis or chronic inflammation. Neoplastic process cannot be excluded. Correlation with urology exam is recommended.   Electronically Signed   By: Lahoma Crocker M.D.   On: 10/05/2013 13:37   . sodium chloride   Intravenous Once  . docusate sodium  100 mg Oral BID  . lamoTRIgine  200 mg Oral Daily  . sodium chloride  3 mL Intravenous Q12H  . sodium polystyrene  50 g Oral Once  . tamsulosin  0.4 mg Oral Daily    BMET    Component Value Date/Time   NA  139 10/07/2013 0535   K 5.4* 10/07/2013 0535   CL 106 10/07/2013 0535   CO2 20 10/07/2013 0535   GLUCOSE 113* 10/07/2013 0535   BUN 44* 10/07/2013 0535   CREATININE 4.34* 10/07/2013 0535   CALCIUM 9.1 10/07/2013 0535   GFRNONAA 13* 10/07/2013 0535   GFRAA 15* 10/07/2013 0535   CBC    Component Value Date/Time   WBC 6.8 10/05/2013 0554   RBC 2.62* 10/05/2013 0554   RBC 2.25* 10/04/2013 1452   HGB 8.2* 10/05/2013 0554   HCT 24.8* 10/05/2013 0554   PLT 221 10/05/2013 0554   MCV 94.7 10/05/2013 0554   MCH 31.3 10/05/2013 0554   MCHC 33.1 10/05/2013 0554   RDW 13.0 10/05/2013 0554   LYMPHSABS 1.4 09/29/2013 1208   MONOABS 0.5 09/29/2013 1208   EOSABS 0.3 09/29/2013 1208   BASOSABS 0.0 09/29/2013 1208    Lab Results  Component Value Date   CREATININE 4.34* 10/07/2013   CREATININE 4.70* 10/06/2013   CREATININE 5.46* 10/05/2013    Assessment/Plan:  1. 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 Patient found to have urinary retention and yielded 2L of urine after catheter placement. CT revealed mild B/L hydronephrosis and hydroureter, enlarged prostate gland, thickening of bladder wall (possibly 2/2 to cystitis or chronic inflammation, can't exclude malignancy). Substantial UOP - 4.200L yesterday. Cr trending down slowly, 4.34 today.  Hep panel/HIV negative.             - continue foley              - Urology following, will need outpatient f/u              - SPEP, urine IFE pending 2. Hyperkalemia, mild - due to AKI; kayexalate has already been ordered 3. BPH - management per primary and urology; patient is on Flomax 4. Metabolic acidosis, improving - due to AKI 5. Anemia - baseline hgb is 12-13, however was 7.5 last week (prior to admission). Hgb 7.0 this admission. Labs c/w anemia of chronic disease/inflammation. He has family hx of prostate Ca and MM.  Urine protein elevated, 425.       - SPEP and urine IFE pending 5. Bipolar disorder/ADHD - no evidence of lithium toxicity (Li level is <  0.25).  Duwaine Maxin, DO  [facility], PGY2  [phone]  10/07/13 08:25AM

## 2013-10-08 DIAGNOSIS — R799 Abnormal finding of blood chemistry, unspecified: Secondary | ICD-10-CM

## 2013-10-08 LAB — BASIC METABOLIC PANEL
Anion gap: 12 (ref 5–15)
BUN: 43 mg/dL — AB (ref 6–23)
CALCIUM: 9.2 mg/dL (ref 8.4–10.5)
CO2: 24 meq/L (ref 19–32)
CREATININE: 4.01 mg/dL — AB (ref 0.50–1.35)
Chloride: 104 mEq/L (ref 96–112)
GFR calc Af Amer: 17 mL/min — ABNORMAL LOW (ref >90)
GFR calc non Af Amer: 15 mL/min — ABNORMAL LOW (ref >90)
GLUCOSE: 98 mg/dL (ref 70–99)
Potassium: 5.1 mEq/L (ref 3.7–5.3)
Sodium: 140 mEq/L (ref 137–147)

## 2013-10-08 NOTE — Progress Notes (Signed)
Patient ID: Adam Herring  male  JAS:[id number]    DOB: 04/18/1950    DOA: 10/04/2013  PCP: Joycelyn Man, MD  Assessment/Plan: Principal Problem:   Acute renal failure: From bladder outlet obstruction or possibly drug-induced interstitial nephritis from NSAIDS   - only marginally improving, Cr 4.0 despite foley, likely has component of chronic medical renal disease  - CT of the abdomen and pelvis showed mild bilateral hydronephrosis and hydroureter, enlarged prostate gland with indentation of the bladder base - Urology was consulted, continue Flomax, Foley catheter, urine culture showed no growth - Outpatient workup for BPH by urology - UPEP pending, monoclonal protein/M spike + on the SPEP, light chains ordered   Active Problems: Hyperkalemia - improved  BPH - PSA slightly elevated, 5.76, urology consulted however PSA inconclusive due to Foley catheterization, outpatient further workup - Started on Flomax    Bipolar depression/ADD - Patient is on Adderall and lithium, reported that lithium started recently within 2-3 weeks   Anemia, unspecified - Hemoglobin stable   DVT Prophylaxis: SCDs  Code Status:  Family Communication: Discussed with patient   Disposition:  Consultants:  Nephrology  Urology  Procedures:  None  Antibiotics:  None    Subjective: Patient seen and examined, no complaints, frustrated over how slow the improvement is   Objective: Weight change:   Intake/Output Summary (Last 24 hours) at 10/08/13 1302 Last data filed at 10/08/13 0900  Gross per 24 hour  Intake    240 ml  Output   2600 ml  Net  -2360 ml   Blood pressure 122/71, pulse 98, temperature 98.3 F (36.8 C), temperature source Oral, resp. rate 21, height 6' 3.5" (1.918 m), weight 77.111 kg (170 lb), SpO2 99.00%.  Physical Exam: General: A x O x3 CVS: S1-S2 clear Chest: CTAB Abdomen: soft nontender, nondistended, normal bowel sounds  Extremities: no c/c/e  bilaterally   Lab Results: Basic Metabolic Panel:  Recent Labs Lab 10/05/13 0554  10/07/13 0535 10/08/13 0420  NA 140  < > 139 140  K 5.4*  < > 5.4* 5.1  CL 106  < > 106 104  CO2 20  < > 20 24  GLUCOSE 94  < > 113* 98  BUN 56*  < > 44* 43*  CREATININE 5.46*  < > 4.34* 4.01*  CALCIUM 9.0  < > 9.1 9.2  PHOS 5.2*  --   --   --   < > = values in this interval not displayed. Liver Function Tests:  Recent Labs Lab 10/04/13 1209 10/04/13 2215 10/05/13 0554  AST 15  --  14  ALT 13  --  8  ALKPHOS 63  --  57  BILITOT 0.2*  --  0.3  PROT 8.2  --  7.2  ALBUMIN 3.9 3.6 3.6   No results found for this basename: LIPASE, AMYLASE,  in the last 168 hours No results found for this basename: AMMONIA,  in the last 168 hours CBC:  Recent Labs Lab 10/04/13 1209 10/05/13 0554  WBC 5.0 6.8  HGB 7.0* 8.2*  HCT 21.9* 24.8*  MCV 96.9 94.7  PLT 224 221   Cardiac Enzymes: No results found for this basename: CKTOTAL, CKMB, CKMBINDEX, TROPONINI,  in the last 168 hours BNP: No components found with this basename: POCBNP,  CBG: No results found for this basename: GLUCAP,  in the last 168 hours   Micro Results: Recent Results (from the past 240 hour(s))  URINE CULTURE  Status: None   Collection Time    10/06/13 12:40 AM      Result Value Ref Range Status   Specimen Description URINE, CATHETERIZED   Final   Special Requests NONE   Final   Culture  Setup Time     Final   Value: 10/06/2013 01:29     Performed at [place]     Final   Value: NO GROWTH     Performed at [facility]   Culture     Final   Value: NO GROWTH     Performed at [facility]   Report Status 10/07/2013 FINAL   Final    Studies/Results: US Renal  10/04/2013   CLINICAL DATA:  Acute renal failure.  History of testicular cancer.  EXAM: RENAL/URINARY TRACT ULTRASOUND COMPLETE  COMPARISON:  None.  FINDINGS: Right Kidney:  Length: 11.0 cm. Echogenicity within normal  limits. Mild dilatation of the collecting system.  Left Kidney:  Length: 10.9 cm. Echogenicity within normal limits. No hydronephrosis visualized. 1.0 x 0.9 x 0.8 cm rounded, exophytic, hypoechoic mass arising from the midportion of the kidney.  Bladder:  Not visualized. Foley catheter in place. There is a 4.6 x 4.1 x 3.9 cm rounded mass like area in the expected position of the urinary bladder.  IMPRESSION: 1. Mild right hydronephrosis. 2. 1.0 cm probable mildly complex exophytic cyst arising from the midportion of the left kidney. A solid mass cannot be excluded. This could be better determined with an elective, outpatient pre and postcontrast magnetic resonance imaging examination of the kidneys. 3. Probable moderately enlarged prostate gland in the inferior pelvis. A bladder mass is less likely.   Electronically Signed   By: Enrique Sack M.D.   On: 10/04/2013 21:00    Medications: Scheduled Meds: . sodium chloride   Intravenous Once  . docusate sodium  100 mg Oral BID  . lamoTRIgine  200 mg Oral Daily  . sodium chloride  3 mL Intravenous Q12H  . sodium polystyrene  50 g Oral Once  . tamsulosin  0.4 mg Oral Daily      LOS: 4 days   Shelvy Heckert M.D. [facility] 10/08/2013, 1:02 PM Pager: [phone]  If 7PM-7AM, please contact night-coverage [email or web address] Password [email or web address]  **Disclaimer: This note was dictated with voice recognition software. Similar sounding words can inadvertently be transcribed and this note may contain transcription errors which may not have been corrected upon publication of note.**

## 2013-10-08 NOTE — Progress Notes (Signed)
S:  Doing ok.  Metallic taste in his mouth and appetite improving.  O:BP 124/78  Pulse 95  Temp(Src) 98.5 F (36.9 C) (Oral)  Resp 17  Ht 6' 3.5" (1.918 m)  Wt 77.111 kg (170 lb)  BMI 20.96 kg/m2  SpO2 100%  Intake/Output Summary (Last 24 hours) at 10/08/13 0847 Last data filed at 10/08/13 0447  Gross per 24 hour  Intake      0 ml  Output   3100 ml  Net  -3100 ml   Intake/Output: I/O last 3 completed shifts: In: 240 [P.O.:240] Out: 5100 [Urine:5100]  Intake/Output this shift:    Weight change:  Gen: in bed in NAD  CVS: RRR, no m/r/g  Resp: Clear to auscultation bilaterally, no rales/rhonchi  Abd: +BS, soft, suprapubic tenderness, no distention/rebound or guarding  GU: 800cc dark yellow/orange urine in foley bag Extremities:  Non-tender, non-edematous  Recent Labs Lab 10/04/13 1209 10/04/13 2215 10/05/13 0554 10/06/13 0853 10/07/13 0535 10/08/13 0420  NA 141 142 140 141 139 140  K 6.2* 5.7* 5.4* 5.3 5.4* 5.1  CL 107 110 106 108 106 104  CO2 21 19 20 20 20 24   GLUCOSE 105* 132* 94 99 113* 98  BUN 60* 55* 56* 47* 44* 43*  CREATININE 6.21* 5.74* 5.46* 4.70* 4.34* 4.01*  ALBUMIN 3.9 3.6 3.6  --   --   --   CALCIUM 9.6 9.5 9.0 8.8 9.1 9.2  PHOS  --  4.3 5.2*  --   --   --   AST 15  --  14  --   --   --   ALT 13  --  8  --   --   --    Liver Function Tests:  Recent Labs Lab 10/04/13 1209 10/04/13 2215 10/05/13 0554  AST 15  --  14  ALT 13  --  8  ALKPHOS 63  --  57  BILITOT 0.2*  --  0.3  PROT 8.2  --  7.2  ALBUMIN 3.9 3.6 3.6   CBC:  Recent Labs Lab 10/04/13 1209 10/05/13 0554  WBC 5.0 6.8  HGB 7.0* 8.2*  HCT 21.9* 24.8*  MCV 96.9 94.7  PLT 224 221   Studies/Results: No results found. . sodium chloride   Intravenous Once  . docusate sodium  100 mg Oral BID  . lamoTRIgine  200 mg Oral Daily  . sodium chloride  3 mL Intravenous Q12H  . sodium polystyrene  50 g Oral Once  . tamsulosin  0.4 mg Oral Daily    BMET    Component Value  Date/Time   NA 140 10/08/2013 0420   K 5.1 10/08/2013 0420   CL 104 10/08/2013 0420   CO2 24 10/08/2013 0420   GLUCOSE 98 10/08/2013 0420   BUN 43* 10/08/2013 0420   CREATININE 4.01* 10/08/2013 0420   CALCIUM 9.2 10/08/2013 0420   GFRNONAA 15* 10/08/2013 0420   GFRAA 17* 10/08/2013 0420   CBC    Component Value Date/Time   WBC 6.8 10/05/2013 0554   RBC 2.62* 10/05/2013 0554   RBC 2.25* 10/04/2013 1452   HGB 8.2* 10/05/2013 0554   HCT 24.8* 10/05/2013 0554   PLT 221 10/05/2013 0554   MCV 94.7 10/05/2013 0554   MCH 31.3 10/05/2013 0554   MCHC 33.1 10/05/2013 0554   RDW 13.0 10/05/2013 0554   LYMPHSABS 1.4 09/29/2013 1208   MONOABS 0.5 09/29/2013 1208   EOSABS 0.3 09/29/2013 1208   BASOSABS 0.0 09/29/2013  1208   Lab Results  Component Value Date   CREATININE 4.01* 10/08/2013   CREATININE 4.34* 10/07/2013   CREATININE 4.70* 10/06/2013    Assessment/Plan:  1. 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 Patient found to have urinary retention and yielded 2L of urine after catheter placement. CT revealed mild B/L hydronephrosis and hydroureter, enlarged prostate gland, thickening of bladder wall (possibly 2/2 to cystitis or chronic inflammation, can't exclude malignancy). Substantial UOP - 3.1L yesterday. Cr trending down slowly, 4.01 today.              - appreciate Urology recommendations, will continue Foley catheter             - SPEP, urine IFE pending  2. Hyperkalemia, resolved - due to AKI; treated with kayexalate 08/28 3. BPH - management per primary and urology; patient is on Flomax 4. Metabolic acidosis, resolved - due to AKI 5. Anemia - baseline hgb is 12-13, however was 7.5 last week (prior to admission). Hgb 7.0 this admission. Labs c/w anemia of chronic disease/inflammation. He has family hx of prostate Ca and MM. Urine protein elevated, 425.            - SPEP and urine IFE pending  5. Bipolar disorder/ADHD - no evidence of lithium toxicity (Li level is < 0.25).  Duwaine Maxin,  DO  [place], PGY2  [phone]  10/08/13 08:47AM

## 2013-10-08 NOTE — Progress Notes (Signed)
  Subjective: Patient reports no urologic complaints. He said he is not quite as pleased with his progress as the hope to be. He is tolerating his catheter. He said his urine became bloody 2 days ago and then cleared but has become slightly bloody again. He is not having any bladder spasms.  Objective: Vital signs in last 24 hours: Temp:  [97.9 F (36.6 C)-98.5 F (36.9 C)] 98.5 F (36.9 C) (08/29 0447) Pulse Rate:  [89-95] 95 (08/29 0447) Resp:  [17-18] 17 (08/29 0447) BP: (124-147)/(76-80) 124/78 mmHg (08/29 0447) SpO2:  [100 %] 100 % (08/29 0447) Weight:  [77.111 kg (170 lb)] 77.111 kg (170 lb) (08/28 2100)  Intake/Output from previous day: 08/28 0701 - 08/29 0700 In: -  Out: 3100 [Urine:3100] Intake/Output this shift:    Physical Exam:  Abdomen is soft and nontender. Foley catheter is indwelling and draining slightly pink urine. No clots noted.  Lab Results: No results found for this basename: HGB, HCT,  in the last 72 hours BMET  Recent Labs  10/07/13 0535 10/08/13 0420  NA 139 140  K 5.4* 5.1  CL 106 104  CO2 20 24  GLUCOSE 113* 98  BUN 44* 43*  CREATININE 4.34* 4.01*  CALCIUM 9.1 9.2   No results found for this basename: LABPT, INR,  in the last 72 hours No results found for this basename: LABURIN,  in the last 72 hours Results for orders placed during the hospital encounter of 10/04/13  URINE CULTURE     Status: None   Collection Time    10/06/13 12:40 AM      Result Value Ref Range Status   Specimen Description URINE, CATHETERIZED   Final   Special Requests NONE   Final   Culture  Setup Time     Final   Value: 10/06/2013 01:29     Performed at [place]     Final   Value: NO GROWTH     Performed at [facility]   Culture     Final   Value: NO GROWTH     Performed at [facility]   Report Status 10/07/2013 FINAL   Final    Studies/Results: No results found.  Assessment/Plan: He is tolerating his  Foley and his creatinine at 4.01 is slowly trending downward. It is not improving as fast as one would expect if the creatinine elevation was to slowly to outlet obstruction and therefore this would indicate the likely presence of underlying medical renal disease. This is currently being evaluated. He inquired about Foley removal but told me that he is tolerating it.  Although he would probably do fine without the catheter my recommendation is that he remain indwelling for now since he is experiencing some further improvement in his creatinine and if there was some degree of outlet obstruction I would not want that to compromise his renal function improvement.   LOS: 4 days   Londan Coplen C 10/08/2013, 8:15 AM

## 2013-10-08 NOTE — Progress Notes (Signed)
I have personally seen and examined this patient and agree with the assessment/plan as outlined above by Redmond Pulling DO (PGY2). Plans noted for leaving Foley in place at DC given BOO. Will need further input from Hematology regarding his M-Spike on SPEP- will check light chains. UPEP pending. Amaal Dimartino K.,MD 10/08/2013 11:39 AM

## 2013-10-09 LAB — RENAL FUNCTION PANEL
ALBUMIN: 3.3 g/dL — AB (ref 3.5–5.2)
ANION GAP: 10 (ref 5–15)
BUN: 45 mg/dL — AB (ref 6–23)
CO2: 25 mEq/L (ref 19–32)
Calcium: 9.4 mg/dL (ref 8.4–10.5)
Chloride: 101 mEq/L (ref 96–112)
Creatinine, Ser: 3.96 mg/dL — ABNORMAL HIGH (ref 0.50–1.35)
GFR, EST AFRICAN AMERICAN: 17 mL/min — AB (ref >90)
GFR, EST NON AFRICAN AMERICAN: 15 mL/min — AB (ref >90)
Glucose, Bld: 107 mg/dL — ABNORMAL HIGH (ref 70–99)
POTASSIUM: 5.2 meq/L (ref 3.7–5.3)
Phosphorus: 4.8 mg/dL — ABNORMAL HIGH (ref 2.3–4.6)
SODIUM: 136 meq/L — AB (ref 137–147)

## 2013-10-09 LAB — CBC WITH DIFFERENTIAL/PLATELET
BASOS ABS: 0 10*3/uL (ref 0.0–0.1)
Basophils Relative: 1 % (ref 0–1)
Eosinophils Absolute: 0.4 10*3/uL (ref 0.0–0.7)
Eosinophils Relative: 7 % — ABNORMAL HIGH (ref 0–5)
HEMATOCRIT: 24.5 % — AB (ref 39.0–52.0)
Hemoglobin: 8.3 g/dL — ABNORMAL LOW (ref 13.0–17.0)
Lymphocytes Relative: 26 % (ref 12–46)
Lymphs Abs: 1.4 10*3/uL (ref 0.7–4.0)
MCH: 32.4 pg (ref 26.0–34.0)
MCHC: 33.9 g/dL (ref 30.0–36.0)
MCV: 95.7 fL (ref 78.0–100.0)
MONO ABS: 0.6 10*3/uL (ref 0.1–1.0)
Monocytes Relative: 11 % (ref 3–12)
NEUTROS ABS: 3 10*3/uL (ref 1.7–7.7)
Neutrophils Relative %: 55 % (ref 43–77)
Platelets: 213 10*3/uL (ref 150–400)
RBC: 2.56 MIL/uL — ABNORMAL LOW (ref 4.22–5.81)
RDW: 12.6 % (ref 11.5–15.5)
WBC: 5.5 10*3/uL (ref 4.0–10.5)

## 2013-10-09 NOTE — Progress Notes (Signed)
Patient ID: Adam Herring  male  WER:[id number]    DOB: [birth year]    DOA: 10/04/2013  PCP: Joycelyn Man, MD  Assessment/Plan: Principal Problem:   Acute renal failure: From bladder outlet obstruction or possibly drug-induced interstitial nephritis from NSAIDS   - slowly improving, Cr 3.9, cont foley, likely has component of chronic medical renal disease  - CT of the abdomen and pelvis showed mild bilateral hydronephrosis and hydroureter, enlarged prostate gland with indentation of the bladder base - Urology was consulted, continue Flomax, Foley catheter, urine culture showed no growth - Outpatient workup for BPH by urology - UPEP pending, monoclonal protein/M spike + on the SPEP, light chains pending, hema-onc consulted, patient reports his mother had multiple myeloma.    Active Problems: Hyperkalemia - improved  BPH - PSA slightly elevated, 5.76, urology consulted however PSA inconclusive due to Foley catheterization, outpatient further workup - Started on Flomax    Bipolar depression/ADD - Patient is on Adderall and lithium, reported that lithium started recently within 2-3 weeks   Anemia, unspecified - Hemoglobin stable   DVT Prophylaxis: SCDs  Code Status:FC  Family Communication: Discussed with patient   Disposition:  Consultants:  Nephrology  Urology  Hematology  Procedures:  None  Antibiotics:  None    Subjective: Patient seen and examined, no specific complaints  Objective: Weight change: 0 kg (0 lb)  Intake/Output Summary (Last 24 hours) at 10/09/13 1208 Last data filed at 10/09/13 0900  Gross per 24 hour  Intake   1080 ml  Output   2150 ml  Net  -1070 ml   Blood pressure 118/76, pulse 96, temperature 98.5 F (36.9 C), temperature source Oral, resp. rate 19, height 6' 3.5" (1.918 m), weight 77.111 kg (170 lb), SpO2 100.00%.  Physical Exam: General: A x O x3 CVS: S1-S2 clear Chest: CTAB Abdomen: soft NT, ND, NBS Extremities:  no c/c/e bilaterally   Lab Results: Basic Metabolic Panel:  Recent Labs Lab 10/08/13 0420 10/09/13 0549  NA 140 136*  K 5.1 5.2  CL 104 101  CO2 24 25  GLUCOSE 98 107*  BUN 43* 45*  CREATININE 4.01* 3.96*  CALCIUM 9.2 9.4  PHOS  --  4.8*   Liver Function Tests:  Recent Labs Lab 10/04/13 1209  10/05/13 0554 10/09/13 0549  AST 15  --  14  --   ALT 13  --  8  --   ALKPHOS 63  --  57  --   BILITOT 0.2*  --  0.3  --   PROT 8.2  --  7.2  --   ALBUMIN 3.9  < > 3.6 3.3*  < > = values in this interval not displayed. No results found for this basename: LIPASE, AMYLASE,  in the last 168 hours No results found for this basename: AMMONIA,  in the last 168 hours CBC:  Recent Labs Lab 10/05/13 0554 10/09/13 0549  WBC 6.8 5.5  NEUTROABS  --  3.0  HGB 8.2* 8.3*  HCT 24.8* 24.5*  MCV 94.7 95.7  PLT 221 213   Cardiac Enzymes: No results found for this basename: CKTOTAL, CKMB, CKMBINDEX, TROPONINI,  in the last 168 hours BNP: No components found with this basename: POCBNP,  CBG: No results found for this basename: GLUCAP,  in the last 168 hours   Micro Results: Recent Results (from the past 240 hour(s))  URINE CULTURE     Status: None   Collection Time    10/06/13 12:40 AM  Result Value Ref Range Status   Specimen Description URINE, CATHETERIZED   Final   Special Requests NONE   Final   Culture  Setup Time     Final   Value: 10/06/2013 01:29     Performed at [place]     Final   Value: NO GROWTH     Performed at [facility]   Culture     Final   Value: NO GROWTH     Performed at [facility]   Report Status 10/07/2013 FINAL   Final    Studies/Results: US Renal  10/04/2013   CLINICAL DATA:  Acute renal failure.  History of testicular cancer.  EXAM: RENAL/URINARY TRACT ULTRASOUND COMPLETE  COMPARISON:  None.  FINDINGS: Right Kidney:  Length: 11.0 cm. Echogenicity within normal limits. Mild dilatation of the  collecting system.  Left Kidney:  Length: 10.9 cm. Echogenicity within normal limits. No hydronephrosis visualized. 1.0 x 0.9 x 0.8 cm rounded, exophytic, hypoechoic mass arising from the midportion of the kidney.  Bladder:  Not visualized. Foley catheter in place. There is a 4.6 x 4.1 x 3.9 cm rounded mass like area in the expected position of the urinary bladder.  IMPRESSION: 1. Mild right hydronephrosis. 2. 1.0 cm probable mildly complex exophytic cyst arising from the midportion of the left kidney. A solid mass cannot be excluded. This could be better determined with an elective, outpatient pre and postcontrast magnetic resonance imaging examination of the kidneys. 3. Probable moderately enlarged prostate gland in the inferior pelvis. A bladder mass is less likely.   Electronically Signed   By: Enrique Sack M.D.   On: 10/04/2013 21:00    Medications: Scheduled Meds: . sodium chloride   Intravenous Once  . docusate sodium  100 mg Oral BID  . lamoTRIgine  200 mg Oral Daily  . sodium chloride  3 mL Intravenous Q12H  . sodium polystyrene  50 g Oral Once  . tamsulosin  0.4 mg Oral Daily      LOS: 5 days   Diannah Rindfleisch M.D. [facility] 10/09/2013, 12:08 PM Pager: [phone]  If 7PM-7AM, please contact night-coverage [email or web address] Password [email or web address]  **Disclaimer: This note was dictated with voice recognition software. Similar sounding words can inadvertently be transcribed and this note may contain transcription errors which may not have been corrected upon publication of note.**

## 2013-10-09 NOTE — Progress Notes (Signed)
Patient resting in bed and visiting with family/friends. Call light in reach. Patient A&O x4. States he does not need anything at this time. Wrote my name and number on board.

## 2013-10-09 NOTE — Progress Notes (Signed)
K 5.1. Patient to remain on telemetry until discussed with Dr. Tana Coast.  Joellen Jersey, RN.

## 2013-10-09 NOTE — Progress Notes (Signed)
S:  Doing well, has no complaints. O:BP 105/58  Pulse 100  Temp(Src) 98.4 F (36.9 C) (Oral)  Resp 18  Ht 6' 3.5" (1.918 m)  Wt 170 lb (77.111 kg)  BMI 20.96 kg/m2  SpO2 100%  Intake/Output Summary (Last 24 hours) at 10/09/13 0912 Last data filed at 10/09/13 0531  Gross per 24 hour  Intake    840 ml  Output   2150 ml  Net  -1310 ml   Intake/Output: I/O last 3 completed shifts: In: 1080 [P.O.:1080] Out: 3600 [Urine:3600]  Intake/Output this shift:    Weight change: 0 lb (0 kg) Gen: in bed in NAD, pleasant CVS: RRR, no m/r/g  Resp: Clear to auscultation bilaterally, no rales/rhonchi  Abd: +BS, soft, nontender, no distention/rebound or guarding  GU: 200cc light yellow urine in foley bag Extremities:  Non-tender, non-edematous  Recent Labs Lab 10/04/13 1209 10/04/13 2215 10/05/13 0554 10/06/13 0853 10/07/13 0535 10/08/13 0420 10/09/13 0549  NA 141 142 140 141 139 140 136*  K 6.2* 5.7* 5.4* 5.3 5.4* 5.1 5.2  CL 107 110 106 108 106 104 101  CO2 21 19 20 20 20 24 25   GLUCOSE 105* 132* 94 99 113* 98 107*  BUN 60* 55* 56* 47* 44* 43* 45*  CREATININE 6.21* 5.74* 5.46* 4.70* 4.34* 4.01* 3.96*  ALBUMIN 3.9 3.6 3.6  --   --   --  3.3*  CALCIUM 9.6 9.5 9.0 8.8 9.1 9.2 9.4  PHOS  --  4.3 5.2*  --   --   --  4.8*  AST 15  --  14  --   --   --   --   ALT 13  --  8  --   --   --   --    Liver Function Tests:  Recent Labs Lab 10/04/13 1209 10/04/13 2215 10/05/13 0554 10/09/13 0549  AST 15  --  14  --   ALT 13  --  8  --   ALKPHOS 63  --  57  --   BILITOT 0.2*  --  0.3  --   PROT 8.2  --  7.2  --   ALBUMIN 3.9 3.6 3.6 3.3*   CBC:  Recent Labs Lab 10/04/13 1209 10/05/13 0554 10/09/13 0549  WBC 5.0 6.8 5.5  NEUTROABS  --   --  3.0  HGB 7.0* 8.2* 8.3*  HCT 21.9* 24.8* 24.5*  MCV 96.9 94.7 95.7  PLT 224 221 213   Studies/Results: No results found. . sodium chloride   Intravenous Once  . docusate sodium  100 mg Oral BID  . lamoTRIgine  200 mg Oral Daily   . sodium chloride  3 mL Intravenous Q12H  . sodium polystyrene  50 g Oral Once  . tamsulosin  0.4 mg Oral Daily    BMET    Component Value Date/Time   NA 136* 10/09/2013 0549   K 5.2 10/09/2013 0549   CL 101 10/09/2013 0549   CO2 25 10/09/2013 0549   GLUCOSE 107* 10/09/2013 0549   BUN 45* 10/09/2013 0549   CREATININE 3.96* 10/09/2013 0549   CALCIUM 9.4 10/09/2013 0549   GFRNONAA 15* 10/09/2013 0549   GFRAA 17* 10/09/2013 0549   CBC    Component Value Date/Time   WBC 5.5 10/09/2013 0549   RBC 2.56* 10/09/2013 0549   RBC 2.25* 10/04/2013 1452   HGB 8.3* 10/09/2013 0549   HCT 24.5* 10/09/2013 0549   PLT 213 10/09/2013 0549  MCV 95.7 10/09/2013 0549   MCH 32.4 10/09/2013 0549   MCHC 33.9 10/09/2013 0549   RDW 12.6 10/09/2013 0549   LYMPHSABS 1.4 10/09/2013 0549   MONOABS 0.6 10/09/2013 0549   EOSABS 0.4 10/09/2013 0549   BASOSABS 0.0 10/09/2013 0549   Lab Results  Component Value Date   CREATININE 3.96* 10/09/2013   CREATININE 4.01* 10/08/2013   CREATININE 4.34* 10/07/2013    Assessment/Plan:  1. 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  CT revealed mild B/L hydronephrosis and hydroureter, enlarged prostate gland, thickening of bladder wall (possibly 2/2 to cystitis or chronic inflammation, can't exclude malignancy). UOP 2.6L yesterday. Cr trending down slowly, 3.96 today.              -  continue Foley catheter on discharge per urology             - Patient does have a M spike. His urine IFE pending, as well as urine light chains.  Recommend Hematology consult. 2. Hyperkalemia, resolved  3. BPH - management per primary and urology; patient is on Flomax 4. Metabolic acidosis, resolved - due to AKI 5. Anemia - baseline hgb is 12-13, however was 7.5 last week (prior to admission). Hgb 7.0 this admission. Labs c/w anemia of chronic disease/inflammation. His Hgb has improved to 8.3 today. He has family hx of prostate Ca and MM. Urine protein elevated, 42 and patient does have an M  spike.  Urine IFE and light chains are pending. 5. Bipolar disorder/ADHD - no evidence of lithium toxicity (Li level is < 0.25).  Lucious Groves, DO  [place], [place]  [phone] 10/09/13 9:17 AM

## 2013-10-09 NOTE — Progress Notes (Signed)
I have personally seen and examined this patient and agree with the assessment/plan as outlined above by Heber [place] DO (PGY2). Patient with slow improvement of non-oliguric AKI. No acute HD needs. Concern raised regarding M spike on SPEP and await light chains-- will ask for assistance from hematology to evaluate for plasma cell dyscrasia. Monitor potassium and restrict oral fluid intake to 1.2L/day with dropping sodium Adam Herring K.,MD 10/09/2013 10:34 AM

## 2013-10-10 DIAGNOSIS — Z8547 Personal history of malignant neoplasm of testis: Secondary | ICD-10-CM

## 2013-10-10 DIAGNOSIS — N039 Chronic nephritic syndrome with unspecified morphologic changes: Secondary | ICD-10-CM

## 2013-10-10 DIAGNOSIS — D631 Anemia in chronic kidney disease: Secondary | ICD-10-CM

## 2013-10-10 LAB — RENAL FUNCTION PANEL
Albumin: 3.4 g/dL — ABNORMAL LOW (ref 3.5–5.2)
Anion gap: 13 (ref 5–15)
BUN: 49 mg/dL — AB (ref 6–23)
CHLORIDE: 105 meq/L (ref 96–112)
CO2: 23 meq/L (ref 19–32)
CREATININE: 4.09 mg/dL — AB (ref 0.50–1.35)
Calcium: 9.1 mg/dL (ref 8.4–10.5)
GFR calc Af Amer: 16 mL/min — ABNORMAL LOW (ref >90)
GFR calc non Af Amer: 14 mL/min — ABNORMAL LOW (ref >90)
Glucose, Bld: 104 mg/dL — ABNORMAL HIGH (ref 70–99)
Phosphorus: 4.7 mg/dL — ABNORMAL HIGH (ref 2.3–4.6)
Potassium: 4.9 mEq/L (ref 3.7–5.3)
Sodium: 141 mEq/L (ref 137–147)

## 2013-10-10 LAB — UIFE/LIGHT CHAINS/TP QN, 24-HR UR
ALPHA 1 UR: DETECTED — AB
Albumin, U: DETECTED
Alpha 2, Urine: DETECTED — AB
Beta, Urine: DETECTED — AB
GAMMA UR: DETECTED — AB
TOTAL PROTEIN, URINE-UPE24: 425 mg/dL — AB (ref 5–25)

## 2013-10-10 LAB — KAPPA/LAMBDA LIGHT CHAINS
Kappa free light chain: 13.7 mg/dL — ABNORMAL HIGH (ref 0.33–1.94)
Kappa, lambda light chain ratio: 3.09 — ABNORMAL HIGH (ref 0.26–1.65)
LAMDA FREE LIGHT CHAINS: 4.44 mg/dL — AB (ref 0.57–2.63)

## 2013-10-10 LAB — SAVE SMEAR

## 2013-10-10 NOTE — Progress Notes (Signed)
Renal Attending: Agree with evaluation above by Dr. Redmond Pulling. Urinary obstruction appears to be primary problem;however, was on lithium intermittently in the past.  This primarily a GU issue and will see one more day if stable. Adam Herring

## 2013-10-10 NOTE — Consult Note (Addendum)
[name]  Telephone:([phone]) [place] CONSULTATION NOTE  Adam Herring                                MR#: [id number]  DOB: [birth year]                       CSN#: [id number]  Referring MD:  [facility]    Primary MD: Joycelyn Man, MD    Reason for Consult: Multiple Myeloma   RDE:YCXKGYJ Adam Herring is a 63 y.o.   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   Patient complained of generalized malaise, with anorexia, weight loss and fatigue, as well as metallic taste in his mouth. He had inability to urinate,requiring catheter placement. Until then he had nocturia without hematuria. Denies polydipsia,  Denies risk for Hepatitis or HIV. Patient denies history of recurrent infection or atypical infections such as shingles of meningitis.Patient has family history of multiple myeloma in his mother.  Denies history of abnormal bone pain or bone fracture. No confusion.  CT of the abdomen and pelvis without contrast on 8/26 confirmed bilateral mild hydronephrosis and mild hydroureter.No small bowel obstruction. No ascites or free air. No adenopathy. There is a enlarged prostate gland with indentation of urinary bladder base. Prostate gland measures 5.9 x 4.7 cm. There is significant thickening of urinary bladder wall, likely due to chronic inflammation or cystitis. Probable cyst in midpole of the left kidney measures 9.4 mm was seen.   SPEP shows the monoclonal protein peak accounts for 0.63 g/dL (M spike) of the total 1.53 g/dL of protein in the gamma region  light chains pending. IgG is 1460. IgA 278.IgM low at 45. LDH pending. Retic absolute 42.8. Iron studies pending. Peripheral blood smear ordered for review..  renal and  Urology evaluating the patient from the GU standpoint.  We were requested to see this patient with recommendations.     PMH:  Past Medical History  Diagnosis Date  . ADD (attention deficit disorder)   . Allergy   . Asthma   . Cancer-treated with an orchiectomy and radiation   2003     testicular-seminoma (T1)   . Depression   . Bipolar 1 disorder     Surgeries:  Past Surgical History  Procedure Laterality Date  . Colon surgery      Allergies:  Allergies  Allergen Reactions  . Dust Mite Extract Shortness Of Breath  . Pollen Extract Shortness Of Breath  . Bean Pod Extract Other (See Comments)    Eczema   . Other Other (See Comments)    Potato's - eczema   . Sesame Oil Other (See Comments)    Severe sweating   . Tomato Other (See Comments)    Eczema     Medications:   Prior to Admission:  . sodium chloride   Intravenous Once  . docusate sodium  100 mg Oral BID  . lamoTRIgine  200 mg Oral Daily  . sodium chloride  3 mL Intravenous Q12H  . sodium polystyrene  50 g Oral Once  . tamsulosin  0.4 mg Oral Daily    EHU:[id number], acetaminophen, albuterol, ondansetron (ZOFRAN) IV, ondansetron, polyethylene glycol  ROS:  Constitutional: Denies fevers, chills or abnormal night sweats Eyes: Denies blurriness of vision, double vision or watery eyes Ears, nose, mouth, throat, and face: Denies mucositis or sore throat, metallic taste in mouth Respiratory: Denies cough, dyspnea or wheezes Cardiovascular: Denies palpitations, chest discomfort or lower extremity swelling Gastrointestinal:  anorexia, nausea, vomiting, bowel hypomotility constipation,  Denies a history of pancreatitis or peptic ulcer disease. Endocrine : Denies polyuria, polydipsia or dehydration Skin: Denies abnormal skin rashes Lymphatics: Denies new lymphadenopathy or easy bruising Musculoskeletal: Denies profound muscle weakness, bone pain, history of osteopenia or  osteoporosis Neurological:Denies  numbness or tingling.  Behavioral/Psych :Decreased concentration, no confusion, positive fatigue, mood is stable without anxiety or depression.  All other systems were reviewed with the patient and are negative.   Family History:   Father had Prostate Ca and died in his 31s, mother died in her 77s of Multiple Myeloma   Social History:  reports that he has never smoked. He has never used smokeless tobacco.Consumes about 3 drinks a day.He reports that he uses illicit drugs (Marijuana). Single. On disability. 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   Physical Exam    ECOG PERFORMANCE STATUS: 2  Filed Vitals:   10/10/13 0613  BP: 115/68  Pulse: 95  Temp: 98.6 F (37 C)  Resp: 17   Filed Weights   10/07/13 2100 10/08/13 2057 10/09/13 2031  Weight: 170 lb (77.111 kg) 170 lb (77.111 kg) 170 lb (77.111 kg)    GENERAL:alert, no distress and comfortable SKIN: skin color, texture, turgor are normal, no rashes or significant lesions EYES: normal, conjunctiva are pink and non-injected, sclera clear OROPHARYNX:no exudate, no erythema and lips, buccal mucosa, and tongue normal  NECK: supple, thyroid normal size LYMPH: Shotty left scalene node, no other cervical, supraclavicular, axillary, or inguinal nodes LUNGS: clear to auscultation and percussion with normal breathing effort HEART: regular rate & rhythm, 2/6 systolic murmurs and no lower extremity edema ABDOMEN:abdomen soft, normal bowel sounds. No hepatosplenomegaly  Musculoskeletal:no cyanosis of digits and no clubbing  PSYCH: alert & oriented x 3 with fluent speech NEURO: no focal motor/sensory deficits GU: Right testis without mass  Labs:   Urine immunofixation-slightly restricted mobility in the IgG and kappa Lane  Serum M spike 0.63 IgG 1460, IgA 2078, IgM 45  Serum free kappa light chains 13.7, free lambda light chains 14.44  CBC   Recent Labs Lab 10/04/13 1209 10/05/13 0554 10/09/13 0549  WBC 5.0 6.8 5.5   HGB 7.0* 8.2* 8.3*  HCT 21.9* 24.8* 24.5*  PLT 224 221 213  MCV 96.9 94.7 95.7  MCH 31.0 31.3 32.4  MCHC 32.0 33.1 33.9  RDW 12.2 13.0 12.6  LYMPHSABS  --   --  1.4  MONOABS  --   --  0.6  EOSABS  --   --  0.4  BASOSABS  --   --  0.0     CMP    Recent Labs Lab 10/04/13 1209  10/05/13 0554 10/06/13 0853 10/07/13 0535 10/08/13 0420 10/09/13 0549 10/10/13 0405  NA 141  < > 140 141 139 140 136* 141  K 6.2*  < > 5.4* 5.3 5.4* 5.1 5.2 4.9  CL 107  < > 106 108 106 104 101 105  CO2 21  < > _0 GLUCOSE 105*  < > 94 99 113* 98 107* 104*  BUN 60*  < > 56* 47* 44* 43* 45* 49*  CREATININE 6.21*  < >  5.46* 4.70* 4.34* 4.01* 3.96* 4.09*  CALCIUM 9.6  < > 9.0 8.8 9.1 9.2 9.4 9.1  AST 15  --  14  --   --   --   --   --   ALT 13  --  8  --   --   --   --   --   ALKPHOS 63  --  57  --   --   --   --   --   BILITOT 0.2*  --  0.3  --   --   --   --   --   < > = values in this interval not displayed.     CBC   Recent Labs Lab 10/04/13 1209 10/05/13 0554 10/09/13 0549  WBC 5.0 6.8 5.5  HGB 7.0* 8.2* 8.3*  HCT 21.9* 24.8* 24.5*  PLT 224 221 213  MCV 96.9 94.7 95.7  MCH 31.0 31.3 32.4  MCHC 32.0 33.1 33.9  RDW 12.2 13.0 12.6  LYMPHSABS  --   --  1.4  MONOABS  --   --  0.6  EOSABS  --   --  0.4  BASOSABS  --   --  0.0       Imaging Studies:  Ct Abdomen Pelvis Wo Contrast  10/05/2013     COMPARISON:  Renal ultrasound 10/04/2013  FINDINGS: Sagittal images of the spine are unremarkable.  Unenhanced liver shows no biliary ductal dilatation. Gallbladder is contracted without evidence of calcified gallstones.  The study is limited without IV contrast. The pancreas spleen and adrenals are unremarkable. There is bilateral mild hydronephrosis and mild hydroureter. No calcified renal or ureteral calculi are identified. There is a exophytic probable cyst in midpole of the left kidney measures 9.4 mm and 10.3 Hounsfield units in attenuation.  Moderate colonic stool.   No small bowel obstruction. No ascites or free air. No adenopathy. There is a enlarged prostate gland with indentation of urinary bladder base. Prostate gland measures 5.9 x 4.7 cm. A Foley catheter is noted within a decompressed urinary bladder. Small amount of air within bladder is probable post instrumentation. There is significant thickening of urinary bladder wall. This may be due to chronic inflammation or cystitis. Clinical correlation is necessary. No calcified calculi are noted within depressed urinary bladder.  IMPRESSION: 1. There is mild bilateral hydronephrosis and hydroureter. Probable cyst in midpole of the left kidney measures 9.4 mm. 2. No nephrolithiasis.  No calcified ureteral calculi. 3. Moderate colonic stool. 4. No small bowel or colonic obstruction. 5. There is enlarged prostate gland with indentation of the bladder base. Significant thickening of urinary bladder wall. This may be due to cystitis or chronic inflammation. Neoplastic process cannot be excluded. Correlation with urology exam is recommended.   Electronically Signed   By: Lahoma Crocker M.D.   On: 10/05/2013 13:37   US Renal  10/04/2013    COMPARISON:  None.  FINDINGS: Right Kidney:  Length: 11.0 cm. Echogenicity within normal limits. Mild dilatation of the collecting system.  Left Kidney:  Length: 10.9 cm. Echogenicity within normal limits. No hydronephrosis visualized. 1.0 x 0.9 x 0.8 cm rounded, exophytic, hypoechoic mass arising from the midportion of the kidney.  Bladder:  Not visualized. Foley catheter in place. There is a 4.6 x 4.1 x 3.9 cm rounded mass like area in the expected position of the urinary bladder.  IMPRESSION: 1. Mild right hydronephrosis. 2. 1.0 cm probable mildly complex exophytic cyst arising from the midportion of  the left kidney. A solid mass cannot be excluded. This could be better determined with an elective, outpatient pre and postcontrast magnetic resonance imaging examination of the kidneys. 3.  Probable moderately enlarged prostate gland in the inferior pelvis. A bladder mass is less likely.   Electronically Signed   By: Enrique Sack M.D.   On: 10/04/2013 21:00      A/P: 63 y.o. male with   Serum M spike with a low level urine IgG kappa protein and mild elevation of the serum kappa light chain  Anemia Could be secondary to renal failure or another etiology   seminomatous testicular cancer  in 2003 treated with orchiectomy and radiation to the abdomen/pelvis No apparent recurrence  Renal Failure Acute  Likely Secondary to bladder outlet obstruction or possibly drug-induced interstitial nephritis from NSAIDS   Enlarged prostate       **Disclaimer: This note was dictated with voice recognition software. Similar sounding words can inadvertently be transcribed and this note may contain transcription errors which may not have been corrected upon publication of note.** Rondel Jumbo, PA-C 10/10/2013 10:24 AM Adam Herring was interviewed and examined. He presents with acute renal failure. The renal failure may be secondary to obstructive uropathy, but the creatinine has not improved significantly with placement of a Foley catheter.  He has severe anemia. This could be secondary to renal failure, but other etiologies will be considered.  The serum M spike could reflect a MGUS or multiple myeloma. He has a normal IgG and IgA level and no lytic lesions were seen on the abdomen CT. The serum kappa light chains were mildly elevated in the setting of renal failure. He has mild proteinuria with a low level IgG kappa protein   I think it is reasonable to proceed with a diagnostic bone marrow biopsy as expediently treatment would be indicated if the renal failure is secondary to multiple myeloma.  Recommendations: 1. A bone marrow biopsy to rule out a diagnosis of multiple myeloma 2. Begin erythropoietin and iron if the anemia is proven to be secondary to renal failure 3. I will review  the peripheral blood smear  Hematology will continue following him in the hospital and we will arrange outpatient followup as indicated.  The peripheral blood smear was reviewed on 10/10/2013.  A few ovalocytes. The white cell morphology is unremarkable. The platelets are normal in number. No schistocytes.

## 2013-10-10 NOTE — Progress Notes (Signed)
Offered assistance with bath. Pt stated he is able to complete bath on his own. Tech gave Pt bath supplies and told Pt to let tech know if assistance is needed.

## 2013-10-10 NOTE — Progress Notes (Signed)
Patient ID: Adam Herring  male  XBM:[id number]    DOB: [birth year]    DOA: 10/04/2013  PCP: Joycelyn Man, MD  Assessment/Plan: Principal Problem:   Acute renal failure: From bladder outlet obstruction or possibly drug-induced interstitial nephritis from NSAIDS   - Creatinine plateaued at 4.0, cont foley, likely has component of chronic medical renal disease, ? Multiple myeloma  - CT of the abdomen and pelvis showed mild bilateral hydronephrosis and hydroureter, enlarged prostate gland with indentation of the bladder base - Urology was consulted, continue Flomax, Foley catheter, urine culture showed no growth - Outpatient workup for BPH by urology - monoclonal protein/M spike + on the SPEP, light chains pending, hema-onc consulted, bone marrow biopsy pending today   Active Problems: Hyperkalemia - improved  BPH - PSA slightly elevated, 5.76, urology consulted however PSA inconclusive due to Foley catheterization, outpatient further workup - Started on Flomax    Bipolar depression/ADD - Patient is on Adderall and lithium, reported that lithium started recently within 2-3 weeks   Anemia, unspecified - Hemoglobin stable   DVT Prophylaxis: SCDs  Code Status:FC  Family Communication: Discussed with patient   Disposition:  Consultants:  Nephrology  Urology  Hematology  Procedures:  None  Antibiotics:  None    Subjective: Patient seen and examined, no specific complaints, somewhat apprehensive of the bone marrow biopsy procedure  Objective: Weight change: 0 kg (0 lb)  Intake/Output Summary (Last 24 hours) at 10/10/13 1159 Last data filed at 10/10/13 1021  Gross per 24 hour  Intake    960 ml  Output   1950 ml  Net   -990 ml   Blood pressure 127/72, pulse 92, temperature 98 F (36.7 C), temperature source Oral, resp. rate 18, height 6' 3.5" (1.918 m), weight 77.111 kg (170 lb), SpO2 100.00%.  Physical Exam: General: A x O x3 CVS: S1-S2  clear Chest: CTAB Abdomen: soft NT, ND, NBS Extremities: no c/c/e bilaterally   Lab Results: Basic Metabolic Panel:  Recent Labs Lab 10/09/13 0549 10/10/13 0405  NA 136* 141  K 5.2 4.9  CL 101 105  CO2 25 23  GLUCOSE 107* 104*  BUN 45* 49*  CREATININE 3.96* 4.09*  CALCIUM 9.4 9.1  PHOS 4.8* 4.7*   Liver Function Tests:  Recent Labs Lab 10/04/13 1209  10/05/13 0554 10/09/13 0549 10/10/13 0405  AST 15  --  14  --   --   ALT 13  --  8  --   --   ALKPHOS 63  --  57  --   --   BILITOT 0.2*  --  0.3  --   --   PROT 8.2  --  7.2  --   --   ALBUMIN 3.9  < > 3.6 3.3* 3.4*  < > = values in this interval not displayed. No results found for this basename: LIPASE, AMYLASE,  in the last 168 hours No results found for this basename: AMMONIA,  in the last 168 hours CBC:  Recent Labs Lab 10/05/13 0554 10/09/13 0549  WBC 6.8 5.5  NEUTROABS  --  3.0  HGB 8.2* 8.3*  HCT 24.8* 24.5*  MCV 94.7 95.7  PLT 221 213   Cardiac Enzymes: No results found for this basename: CKTOTAL, CKMB, CKMBINDEX, TROPONINI,  in the last 168 hours BNP: No components found with this basename: POCBNP,  CBG: No results found for this basename: GLUCAP,  in the last 168 hours   Micro Results: Recent Results (from  the past 240 hour(s))  URINE CULTURE     Status: None   Collection Time    10/06/13 12:40 AM      Result Value Ref Range Status   Specimen Description URINE, CATHETERIZED   Final   Special Requests NONE   Final   Culture  Setup Time     Final   Value: 10/06/2013 01:29     Performed at [place]     Final   Value: NO GROWTH     Performed at [facility]   Culture     Final   Value: NO GROWTH     Performed at [facility]   Report Status 10/07/2013 FINAL   Final    Studies/Results: US Renal  10/04/2013   CLINICAL DATA:  Acute renal failure.  History of testicular cancer.  EXAM: RENAL/URINARY TRACT ULTRASOUND COMPLETE  COMPARISON:  None.   FINDINGS: Right Kidney:  Length: 11.0 cm. Echogenicity within normal limits. Mild dilatation of the collecting system.  Left Kidney:  Length: 10.9 cm. Echogenicity within normal limits. No hydronephrosis visualized. 1.0 x 0.9 x 0.8 cm rounded, exophytic, hypoechoic mass arising from the midportion of the kidney.  Bladder:  Not visualized. Foley catheter in place. There is a 4.6 x 4.1 x 3.9 cm rounded mass like area in the expected position of the urinary bladder.  IMPRESSION: 1. Mild right hydronephrosis. 2. 1.0 cm probable mildly complex exophytic cyst arising from the midportion of the left kidney. A solid mass cannot be excluded. This could be better determined with an elective, outpatient pre and postcontrast magnetic resonance imaging examination of the kidneys. 3. Probable moderately enlarged prostate gland in the inferior pelvis. A bladder mass is less likely.   Electronically Signed   By: Enrique Sack M.D.   On: 10/04/2013 21:00    Medications: Scheduled Meds: . sodium chloride   Intravenous Once  . docusate sodium  100 mg Oral BID  . lamoTRIgine  200 mg Oral Daily  . sodium chloride  3 mL Intravenous Q12H  . sodium polystyrene  50 g Oral Once  . tamsulosin  0.4 mg Oral Daily      LOS: 6 days   Odesser Tourangeau M.D. [facility] 10/10/2013, 11:59 AM Pager: [phone]  If 7PM-7AM, please contact night-coverage [email or web address] Password [email or web address]  **Disclaimer: This note was dictated with voice recognition software. Similar sounding words can inadvertently be transcribed and this note may contain transcription errors which may not have been corrected upon publication of note.**

## 2013-10-10 NOTE — Progress Notes (Signed)
Patient ID: Adam Herring, male   DOB: October 03, 1950, 63 y.o.   MRN: [id number] Request received from Dr. Benay Spice for CT guided bone marrow biopsy on pt with remote hx of testicular cancer, admitted now with ARF,anemia, mild proteinuria, mild bilat hydronephrosis/enlarged prostate , serum M spike with low level IgG kappa protein and family hx of myeloma. Additional PMH as below. Exam: pt awake/alert; chest -CTA bilat; heart- RRR; abd- soft,+BS,NT; ext- no edema.  Filed Vitals:   10/09/13 1804 10/09/13 2031 10/10/13 0613 10/10/13 1039  BP: 130/67 121/78 115/68 127/72  Pulse: 101 100 95 92  Temp: 98.6 F (37 C) 98.2 F (36.8 C) 98.6 F (37 C) 98 F (36.7 C)  TempSrc: Oral   Oral  Resp: 21 18 17 18   Height:      Weight:  170 lb (77.111 kg)    SpO2: 98% 100% 100% 100%   Past Medical History  Diagnosis Date  . ADD (attention deficit disorder)   . Allergy   . Asthma   . Cancer     testicular  . Depression   . Bipolar 1 disorder    Past Surgical History  Procedure Laterality Date  . Colon surgery     Ct Abdomen Pelvis Wo Contrast  10/05/2013   CLINICAL DATA:  Hydronephrosis, renal mass  EXAM: CT ABDOMEN AND PELVIS WITHOUT CONTRAST  TECHNIQUE: Multidetector CT imaging of the abdomen and pelvis was performed following the standard protocol without IV contrast.  COMPARISON:  Renal ultrasound 10/04/2013  FINDINGS: Sagittal images of the spine are unremarkable.  Unenhanced liver shows no biliary ductal dilatation. Gallbladder is contracted without evidence of calcified gallstones.  The study is limited without IV contrast. The pancreas spleen and adrenals are unremarkable. There is bilateral mild hydronephrosis and mild hydroureter. No calcified renal or ureteral calculi are identified. There is a exophytic probable cyst in midpole of the left kidney measures 9.4 mm and 10.3 Hounsfield units in attenuation.  Moderate colonic stool.  No small bowel obstruction. No ascites or free air. No adenopathy.  There is a enlarged prostate gland with indentation of urinary bladder base. Prostate gland measures 5.9 x 4.7 cm. A Foley catheter is noted within a decompressed urinary bladder. Small amount of air within bladder is probable post instrumentation. There is significant thickening of urinary bladder wall. This may be due to chronic inflammation or cystitis. Clinical correlation is necessary. No calcified calculi are noted within depressed urinary bladder.  IMPRESSION: 1. There is mild bilateral hydronephrosis and hydroureter. Probable cyst in midpole of the left kidney measures 9.4 mm. 2. No nephrolithiasis.  No calcified ureteral calculi. 3. Moderate colonic stool. 4. No small bowel or colonic obstruction. 5. There is enlarged prostate gland with indentation of the bladder base. Significant thickening of urinary bladder wall. This may be due to cystitis or chronic inflammation. Neoplastic process cannot be excluded. Correlation with urology exam is recommended.   Electronically Signed   By: Lahoma Crocker M.D.   On: 10/05/2013 13:37   US Renal  10/04/2013   CLINICAL DATA:  Acute renal failure.  History of testicular cancer.  EXAM: RENAL/URINARY TRACT ULTRASOUND COMPLETE  COMPARISON:  None.  FINDINGS: Right Kidney:  Length: 11.0 cm. Echogenicity within normal limits. Mild dilatation of the collecting system.  Left Kidney:  Length: 10.9 cm. Echogenicity within normal limits. No hydronephrosis visualized. 1.0 x 0.9 x 0.8 cm rounded, exophytic, hypoechoic mass arising from the midportion of the kidney.  Bladder:  Not visualized. Foley  catheter in place. There is a 4.6 x 4.1 x 3.9 cm rounded mass like area in the expected position of the urinary bladder.  IMPRESSION: 1. Mild right hydronephrosis. 2. 1.0 cm probable mildly complex exophytic cyst arising from the midportion of the left kidney. A solid mass cannot be excluded. This could be better determined with an elective, outpatient pre and postcontrast magnetic  resonance imaging examination of the kidneys. 3. Probable moderately enlarged prostate gland in the inferior pelvis. A bladder mass is less likely.   Electronically Signed   By: Enrique Sack M.D.   On: 10/04/2013 21:00   Results for orders placed during the hospital encounter of 10/04/13  URINE CULTURE      Result Value Ref Range   Specimen Description URINE, CATHETERIZED     Special Requests NONE     Culture  Setup Time       Value: 10/06/2013 01:29     Performed at [facility]       Value: NO GROWTH     Performed at [facility]   Culture       Value: NO GROWTH     Performed at [facility]   Report Status 10/07/2013 FINAL    CBC      Result Value Ref Range   WBC 5.0  4.0 - 10.5 K/uL   RBC 2.26 (*) 4.22 - 5.81 MIL/uL   Hemoglobin 7.0 (*) 13.0 - 17.0 g/dL   HCT 21.9 (*) 39.0 - 52.0 %   MCV 96.9  78.0 - 100.0 fL   MCH 31.0  26.0 - 34.0 pg   MCHC 32.0  30.0 - 36.0 g/dL   RDW 12.2  11.5 - 15.5 %   Platelets 224  150 - 400 K/uL  COMPREHENSIVE METABOLIC PANEL      Result Value Ref Range   Sodium 141  137 - 147 mEq/L   Potassium 6.2 (*) 3.7 - 5.3 mEq/L   Chloride 107  96 - 112 mEq/L   CO2 21  19 - 32 mEq/L   Glucose, Bld 105 (*) 70 - 99 mg/dL   BUN 60 (*) 6 - 23 mg/dL   Creatinine, Ser 6.21 (*) 0.50 - 1.35 mg/dL   Calcium 9.6  8.4 - 10.5 mg/dL   Total Protein 8.2  6.0 - 8.3 g/dL   Albumin 3.9  3.5 - 5.2 g/dL   AST 15  0 - 37 U/L   ALT 13  0 - 53 U/L   Alkaline Phosphatase 63  39 - 117 U/L   Total Bilirubin 0.2 (*) 0.3 - 1.2 mg/dL   GFR calc non Af Amer 9 (*) >90 mL/min   GFR calc Af Amer 10 (*) >90 mL/min   Anion gap 13  5 - 15  URINALYSIS, ROUTINE W REFLEX MICROSCOPIC      Result Value Ref Range   Color, Urine YELLOW  YELLOW   APPearance CLOUDY (*) CLEAR   Specific Gravity, Urine 1.012  1.005 - 1.030   pH 5.0  5.0 - 8.0   Glucose, UA NEGATIVE  NEGATIVE mg/dL   Hgb urine dipstick NEGATIVE  NEGATIVE   Bilirubin Urine NEGATIVE   NEGATIVE   Ketones, ur NEGATIVE  NEGATIVE mg/dL   Protein, ur NEGATIVE  NEGATIVE mg/dL   Urobilinogen, UA 0.2  0.0 - 1.0 mg/dL   Nitrite NEGATIVE  NEGATIVE   Leukocytes, UA MODERATE (*) NEGATIVE  URINE MICROSCOPIC-ADD ON  Result Value Ref Range   Squamous Epithelial / LPF RARE  RARE   WBC, UA 7-10  <3 WBC/hpf   RBC / HPF 0-2  <3 RBC/hpf   Bacteria, UA RARE  RARE  LITHIUM LEVEL      Result Value Ref Range   Lithium Lvl <0.25 (*) 0.80 - 1.40 mEq/L  VITAMIN B12      Result Value Ref Range   Vitamin B-12 444  211 - 911 pg/mL  FOLATE      Result Value Ref Range   Folate >20.0    IRON AND TIBC      Result Value Ref Range   Iron 90  42 - 135 ug/dL   TIBC 200 (*) 215 - 435 ug/dL   Saturation Ratios 45  20 - 55 %   UIBC 110 (*) 125 - 400 ug/dL  FERRITIN      Result Value Ref Range   Ferritin 486 (*) 22 - 322 ng/mL  RETICULOCYTES      Result Value Ref Range   Retic Ct Pct 1.9  0.4 - 3.1 %   RBC. 2.25 (*) 4.22 - 5.81 MIL/uL   Retic Count, Manual 42.8  19.0 - 186.0 K/uL  CREATININE, URINE, RANDOM      Result Value Ref Range   Creatinine, Urine 84.08    SODIUM, URINE, RANDOM      Result Value Ref Range   Sodium, Ur 57    TSH      Result Value Ref Range   TSH 1.050  0.350 - 4.500 uIU/mL  HEMOGLOBIN A1C      Result Value Ref Range   Hemoglobin A1C 5.3  <5.7 %   Mean Plasma Glucose 105  <117 mg/dL  CBC      Result Value Ref Range   WBC 6.8  4.0 - 10.5 K/uL   RBC 2.62 (*) 4.22 - 5.81 MIL/uL   Hemoglobin 8.2 (*) 13.0 - 17.0 g/dL   HCT 24.8 (*) 39.0 - 52.0 %   MCV 94.7  78.0 - 100.0 fL   MCH 31.3  26.0 - 34.0 pg   MCHC 33.1  30.0 - 36.0 g/dL   RDW 13.0  11.5 - 15.5 %   Platelets 221  150 - 400 K/uL  LACTATE DEHYDROGENASE      Result Value Ref Range   LDH 169  94 - 250 U/L  COMPREHENSIVE METABOLIC PANEL      Result Value Ref Range   Sodium 140  137 - 147 mEq/L   Potassium 5.4 (*) 3.7 - 5.3 mEq/L   Chloride 106  96 - 112 mEq/L   CO2 20  19 - 32 mEq/L   Glucose, Bld  94  70 - 99 mg/dL   BUN 56 (*) 6 - 23 mg/dL   Creatinine, Ser 5.46 (*) 0.50 - 1.35 mg/dL   Calcium 9.0  8.4 - 10.5 mg/dL   Total Protein 7.2  6.0 - 8.3 g/dL   Albumin 3.6  3.5 - 5.2 g/dL   AST 14  0 - 37 U/L   ALT 8  0 - 53 U/L   Alkaline Phosphatase 57  39 - 117 U/L   Total Bilirubin 0.3  0.3 - 1.2 mg/dL   GFR calc non Af Amer 10 (*) >90 mL/min   GFR calc Af Amer 12 (*) >90 mL/min   Anion gap 14  5 - 15  PHOSPHORUS      Result Value Ref Range  Phosphorus 5.2 (*) 2.3 - 4.6 mg/dL  RENAL FUNCTION PANEL      Result Value Ref Range   Sodium 142  137 - 147 mEq/L   Potassium 5.7 (*) 3.7 - 5.3 mEq/L   Chloride 110  96 - 112 mEq/L   CO2 19  19 - 32 mEq/L   Glucose, Bld 132 (*) 70 - 99 mg/dL   BUN 55 (*) 6 - 23 mg/dL   Creatinine, Ser 5.74 (*) 0.50 - 1.35 mg/dL   Calcium 9.5  8.4 - 10.5 mg/dL   Phosphorus 4.3  2.3 - 4.6 mg/dL   Albumin 3.6  3.5 - 5.2 g/dL   GFR calc non Af Amer 9 (*) >90 mL/min   GFR calc Af Amer 11 (*) >90 mL/min   Anion gap 13  5 - 15  PSA      Result Value Ref Range   PSA 5.76 (*) <=4.00 ng/mL  PROTEIN ELECTROPHORESIS, SERUM      Result Value Ref Range   Total Protein ELP 6.9  6.0 - 8.3 g/dL   Albumin ELP 51.8 (*) 55.8 - 66.1 %   Alpha-1-Globulin 5.0 (*) 2.9 - 4.9 %   Alpha-2-Globulin 9.0  7.1 - 11.8 %   Beta Globulin 5.8  4.7 - 7.2 %   Beta 2 6.2  3.2 - 6.5 %   Gamma Globulin 22.2 (*) 11.1 - 18.8 %   M-Spike, % 0.63     SPE Interp. (NOTE)     Comment (NOTE)    IMMUNOFIXATION ELECTROPHORESIS, URINE (WITH TOT PROT)      Result Value Ref Range   Time RANDOM     Volume, Urine RANDOM     Total Protein, Urine 425 (*) 5 - 25 mg/dL   Total Protein, Urine-Ur/day NOT CALC  <150 mg/day   Albumin, U DETECTED  DETECTED   Alpha 1, Urine DETECTED (*) NONE DETECTED   Alpha 2, Urine DETECTED (*) NONE DETECTED   Beta, Urine DETECTED (*) NONE DETECTED   Gamma Globulin, Urine DETECTED (*) NONE DETECTED   Immunofixation, Urine (NOTE)    BASIC METABOLIC PANEL       Result Value Ref Range   Sodium 141  137 - 147 mEq/L   Potassium 5.3  3.7 - 5.3 mEq/L   Chloride 108  96 - 112 mEq/L   CO2 20  19 - 32 mEq/L   Glucose, Bld 99  70 - 99 mg/dL   BUN 47 (*) 6 - 23 mg/dL   Creatinine, Ser 4.70 (*) 0.50 - 1.35 mg/dL   Calcium 8.8  8.4 - 10.5 mg/dL   GFR calc non Af Amer 12 (*) >90 mL/min   GFR calc Af Amer 14 (*) >90 mL/min   Anion gap 13  5 - 15  PROTEIN / CREATININE RATIO, URINE      Result Value Ref Range   Creatinine, Urine 59.08     Total Protein, Urine 143.1     PROTEIN CREATININE RATIO 2.42 (*) 0.00 - 0.15  HEPATITIS PANEL, ACUTE      Result Value Ref Range   Hepatitis B Surface Ag NEGATIVE  NEGATIVE   HCV Ab NEGATIVE  NEGATIVE   Hep A IgM NON REACTIVE  NON REACTIVE   Hep B C IgM NON REACTIVE  NON REACTIVE  HIV ANTIBODY (ROUTINE TESTING)      Result Value Ref Range   HIV 1&2 Ab, 4th Generation NONREACTIVE  NONREACTIVE  BASIC METABOLIC PANEL  Result Value Ref Range   Sodium 139  137 - 147 mEq/L   Potassium 5.4 (*) 3.7 - 5.3 mEq/L   Chloride 106  96 - 112 mEq/L   CO2 20  19 - 32 mEq/L   Glucose, Bld 113 (*) 70 - 99 mg/dL   BUN 44 (*) 6 - 23 mg/dL   Creatinine, Ser 4.34 (*) 0.50 - 1.35 mg/dL   Calcium 9.1  8.4 - 10.5 mg/dL   GFR calc non Af Amer 13 (*) >90 mL/min   GFR calc Af Amer 15 (*) >90 mL/min   Anion gap 13  5 - 15  BASIC METABOLIC PANEL      Result Value Ref Range   Sodium 140  137 - 147 mEq/L   Potassium 5.1  3.7 - 5.3 mEq/L   Chloride 104  96 - 112 mEq/L   CO2 24  19 - 32 mEq/L   Glucose, Bld 98  70 - 99 mg/dL   BUN 43 (*) 6 - 23 mg/dL   Creatinine, Ser 4.01 (*) 0.50 - 1.35 mg/dL   Calcium 9.2  8.4 - 10.5 mg/dL   GFR calc non Af Amer 15 (*) >90 mL/min   GFR calc Af Amer 17 (*) >90 mL/min   Anion gap 12  5 - 15  KAPPA/LAMBDA LIGHT CHAINS      Result Value Ref Range   Kappa free light chain 13.70 (*) 0.33 - 1.94 mg/dL   Lamda free light chains 4.44 (*) 0.57 - 2.63 mg/dL   Kappa, lamda light chain ratio 3.09 (*) 0.26  - 1.65  RENAL FUNCTION PANEL      Result Value Ref Range   Sodium 136 (*) 137 - 147 mEq/L   Potassium 5.2  3.7 - 5.3 mEq/L   Chloride 101  96 - 112 mEq/L   CO2 25  19 - 32 mEq/L   Glucose, Bld 107 (*) 70 - 99 mg/dL   BUN 45 (*) 6 - 23 mg/dL   Creatinine, Ser 3.96 (*) 0.50 - 1.35 mg/dL   Calcium 9.4  8.4 - 10.5 mg/dL   Phosphorus 4.8 (*) 2.3 - 4.6 mg/dL   Albumin 3.3 (*) 3.5 - 5.2 g/dL   GFR calc non Af Amer 15 (*) >90 mL/min   GFR calc Af Amer 17 (*) >90 mL/min   Anion gap 10  5 - 15  CBC WITH DIFFERENTIAL      Result Value Ref Range   WBC 5.5  4.0 - 10.5 K/uL   RBC 2.56 (*) 4.22 - 5.81 MIL/uL   Hemoglobin 8.3 (*) 13.0 - 17.0 g/dL   HCT 24.5 (*) 39.0 - 52.0 %   MCV 95.7  78.0 - 100.0 fL   MCH 32.4  26.0 - 34.0 pg   MCHC 33.9  30.0 - 36.0 g/dL   RDW 12.6  11.5 - 15.5 %   Platelets 213  150 - 400 K/uL   Neutrophils Relative % 55  43 - 77 %   Neutro Abs 3.0  1.7 - 7.7 K/uL   Lymphocytes Relative 26  12 - 46 %   Lymphs Abs 1.4  0.7 - 4.0 K/uL   Monocytes Relative 11  3 - 12 %   Monocytes Absolute 0.6  0.1 - 1.0 K/uL   Eosinophils Relative 7 (*) 0 - 5 %   Eosinophils Absolute 0.4  0.0 - 0.7 K/uL   Basophils Relative 1  0 - 1 %   Basophils Absolute 0.0  0.0 - 0.1 K/uL  IMMUNOFIXATION ELECTROPHORESIS      Result Value Ref Range   Total Protein ELP 7.2  6.0 - 8.3 g/dL   IgG (Immunoglobin G), Serum 1460  650 - 1600 mg/dL   IgA 278  68 - 379 mg/dL   IgM, Serum 45 (*) 41 - 251 mg/dL   Immunofix Electr Int PENDING    RENAL FUNCTION PANEL      Result Value Ref Range   Sodium 141  137 - 147 mEq/L   Potassium 4.9  3.7 - 5.3 mEq/L   Chloride 105  96 - 112 mEq/L   CO2 23  19 - 32 mEq/L   Glucose, Bld 104 (*) 70 - 99 mg/dL   BUN 49 (*) 6 - 23 mg/dL   Creatinine, Ser 4.09 (*) 0.50 - 1.35 mg/dL   Calcium 9.1  8.4 - 10.5 mg/dL   Phosphorus 4.7 (*) 2.3 - 4.6 mg/dL   Albumin 3.4 (*) 3.5 - 5.2 g/dL   GFR calc non Af Amer 14 (*) >90 mL/min   GFR calc Af Amer 16 (*) >90 mL/min   Anion  gap 13  5 - 15  SAVE SMEAR      Result Value Ref Range   Smear Review SMEAR STAINED AND AVAILABLE FOR REVIEW    POC OCCULT BLOOD, ED      Result Value Ref Range   Fecal Occult Bld NEGATIVE  NEGATIVE  TYPE AND SCREEN      Result Value Ref Range   ABO/RH(D) B NEG     Antibody Screen NEG     Sample Expiration 10/07/2013    ABO/RH      Result Value Ref Range   ABO/RH(D) B NEG    PREPARE RBC (CROSSMATCH)      Result Value Ref Range   Order Confirmation ORDER PROCESSED BY BLOOD BANK    TYPE AND SCREEN      Result Value Ref Range   ABO/RH(D) B NEG     Antibody Screen NEG     Sample Expiration 10/07/2013     Unit Number [id number]     Blood Component Type RED CELLS,LR     Unit division 00     Status of Unit ISSUED,FINAL     Transfusion Status OK TO TRANSFUSE     Crossmatch Result Compatible     Tube Crossmatch NOT NEEDED     Unit Number [id number]     Blood Component Type RED CELLS,LR     Unit division 00     Status of Unit REL FROM [facility]     Transfusion Status OK TO TRANSFUSE     Crossmatch Result Compatible     Tube Crossmatch NOT NEEDED    ABO/RH      Result Value Ref Range   ABO/RH(D) B NEG     A/P: Pt with remote hx of testicular cancer; admitted now with ARF,anemia, mild proteinuria, mild bilat hydronephrosis/enlarged prostate ,serum M spike with low level IgG kappa protein and family hx of myeloma. Plan is for CT guided bone marrow biopsy on 9/1 to r/o myeloma/MGUS. Details/risks of procedure d/w pt with his understanding and consent.

## 2013-10-10 NOTE — Progress Notes (Signed)
S:Doing ok.  Appetite good.  O:BP 115/68  Pulse 95  Temp(Src) 98.6 F (37 C) (Oral)  Resp 17  Ht 6' 3.5" (1.918 m)  Wt 77.111 kg (170 lb)  BMI 20.96 kg/m2  SpO2 100%  Intake/Output Summary (Last 24 hours) at 10/10/13 0839 Last data filed at 10/10/13 9826  Gross per 24 hour  Intake    960 ml  Output   2450 ml  Net  -1490 ml   Intake/Output: I/O last 3 completed shifts: In: 1200 [P.O.:1200] Out: 3650 [Urine:3650]  Intake/Output this shift:    Weight change: 0 kg (0 lb) Gen: in bed in NAD, pleasant  CVS: RRR, no m/r/g  Resp: Clear to auscultation bilaterally, no rales/rhonchi  Abd: +BS, soft, nontender, no distention/rebound or guarding  GU: 500cc light yellow urine in foley bag  Extremities: Non-tender, non-edematous   Recent Labs Lab 10/04/13 1209 10/04/13 2215 10/05/13 0554 10/06/13 0853 10/07/13 0535 10/08/13 0420 10/09/13 0549 10/10/13 0405  NA 141 142 140 141 139 140 136* 141  K 6.2* 5.7* 5.4* 5.3 5.4* 5.1 5.2 4.9  CL 107 110 106 108 106 104 101 105  CO2 21 19 20 20 20 24 25 23   GLUCOSE 105* 132* 94 99 113* 98 107* 104*  BUN 60* 55* 56* 47* 44* 43* 45* 49*  CREATININE 6.21* 5.74* 5.46* 4.70* 4.34* 4.01* 3.96* 4.09*  ALBUMIN 3.9 3.6 3.6  --   --   --  3.3* 3.4*  CALCIUM 9.6 9.5 9.0 8.8 9.1 9.2 9.4 9.1  PHOS  --  4.3 5.2*  --   --   --  4.8* 4.7*  AST 15  --  14  --   --   --   --   --   ALT 13  --  8  --   --   --   --   --    Liver Function Tests:  Recent Labs Lab 10/04/13 1209  10/05/13 0554 10/09/13 0549 10/10/13 0405  AST 15  --  14  --   --   ALT 13  --  8  --   --   ALKPHOS 63  --  57  --   --   BILITOT 0.2*  --  0.3  --   --   PROT 8.2  --  7.2  --   --   ALBUMIN 3.9  < > 3.6 3.3* 3.4*  < > = values in this interval not displayed. No results found for this basename: LIPASE, AMYLASE,  in the last 168 hours No results found for this basename: AMMONIA,  in the last 168 hours CBC:  Recent Labs Lab 10/04/13 1209 10/05/13 0554  10/09/13 0549  WBC 5.0 6.8 5.5  NEUTROABS  --   --  3.0  HGB 7.0* 8.2* 8.3*  HCT 21.9* 24.8* 24.5*  MCV 96.9 94.7 95.7  PLT 224 221 213   Cardiac Enzymes: No results found for this basename: CKTOTAL, CKMB, CKMBINDEX, TROPONINI,  in the last 168 hours CBG: No results found for this basename: GLUCAP,  in the last 168 hours  Iron Studies: No results found for this basename: IRON, TIBC, TRANSFERRIN, FERRITIN,  in the last 72 hours Studies/Results: No results found. . sodium chloride   Intravenous Once  . docusate sodium  100 mg Oral BID  . lamoTRIgine  200 mg Oral Daily  . sodium chloride  3 mL Intravenous Q12H  . sodium polystyrene  50 g Oral Once  .  tamsulosin  0.4 mg Oral Daily    BMET    Component Value Date/Time   NA 141 10/10/2013 0405   K 4.9 10/10/2013 0405   CL 105 10/10/2013 0405   CO2 23 10/10/2013 0405   GLUCOSE 104* 10/10/2013 0405   BUN 49* 10/10/2013 0405   CREATININE 4.09* 10/10/2013 0405   CALCIUM 9.1 10/10/2013 0405   GFRNONAA 14* 10/10/2013 0405   GFRAA 16* 10/10/2013 0405   CBC    Component Value Date/Time   WBC 5.5 10/09/2013 0549   RBC 2.56* 10/09/2013 0549   RBC 2.25* 10/04/2013 1452   HGB 8.3* 10/09/2013 0549   HCT 24.5* 10/09/2013 0549   PLT 213 10/09/2013 0549   MCV 95.7 10/09/2013 0549   MCH 32.4 10/09/2013 0549   MCHC 33.9 10/09/2013 0549   RDW 12.6 10/09/2013 0549   LYMPHSABS 1.4 10/09/2013 0549   MONOABS 0.6 10/09/2013 0549   EOSABS 0.4 10/09/2013 0549   BASOSABS 0.0 10/09/2013 0549   Lab Results  Component Value Date   CREATININE 4.09* 10/10/2013   CREATININE 3.96* 10/09/2013   CREATININE 4.01* 10/08/2013    Assessment/Plan:  1. 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 Patient found to have urinary retention and yielded 2L of urine after catheter placement. CT revealed mild B/L hydronephrosis and hydroureter, enlarged prostate gland, thickening of bladder wall (possibly 2/2 to cystitis or chronic inflammation, can't exclude malignancy).  Good UOP - 2.4L yesterday. Cr trending down slowly to 3.96 yesterday but back up to 4.09 today.        - Heme/Onc consulted due to M spike on SPEP; per pt, plans for bone marrow biopsy today       - light chains pending             - appreciate Urology recommendations, will continue Foley catheter  2. Hyperkalemia, resolved - treated with kayexalate 08/28 3. BPH - management per primary and urology; patient is on Flomax 4. Metabolic acidosis, resolved - due to AKI 5. Anemia - baseline hgb is 12-13, however was 7.5 last week (prior to admission). Hgb 7.0 at admission.  Currently stable at 8.3.  Labs c/w anemia of chronic disease/inflammation. He has family hx of prostate Ca and MM. Urine protein elevated, 425.             - Heme/Onc consulted given concern for plasma cell dyscrasia 5. Bipolar disorder/ADHD - no evidence of lithium toxicity (Li level is < 0.25).  Duwaine Maxin, DO  [place], PGY2  [phone]  10/10/13 08:38AM

## 2013-10-11 ENCOUNTER — Inpatient Hospital Stay (HOSPITAL_COMMUNITY): Payer: BC Managed Care – PPO

## 2013-10-11 LAB — RENAL FUNCTION PANEL
Albumin: 3.4 g/dL — ABNORMAL LOW (ref 3.5–5.2)
Anion gap: 13 (ref 5–15)
BUN: 55 mg/dL — ABNORMAL HIGH (ref 6–23)
CHLORIDE: 105 meq/L (ref 96–112)
CO2: 23 meq/L (ref 19–32)
Calcium: 9.5 mg/dL (ref 8.4–10.5)
Creatinine, Ser: 4 mg/dL — ABNORMAL HIGH (ref 0.50–1.35)
GFR, EST AFRICAN AMERICAN: 17 mL/min — AB (ref >90)
GFR, EST NON AFRICAN AMERICAN: 15 mL/min — AB (ref >90)
Glucose, Bld: 106 mg/dL — ABNORMAL HIGH (ref 70–99)
Phosphorus: 4.8 mg/dL — ABNORMAL HIGH (ref 2.3–4.6)
Potassium: 5.4 mEq/L — ABNORMAL HIGH (ref 3.7–5.3)
SODIUM: 141 meq/L (ref 137–147)

## 2013-10-11 LAB — APTT: APTT: 27 s (ref 24–37)

## 2013-10-11 LAB — CBC
HCT: 25.6 % — ABNORMAL LOW (ref 39.0–52.0)
Hemoglobin: 8.3 g/dL — ABNORMAL LOW (ref 13.0–17.0)
MCH: 31.3 pg (ref 26.0–34.0)
MCHC: 32.4 g/dL (ref 30.0–36.0)
MCV: 96.6 fL (ref 78.0–100.0)
PLATELETS: 223 10*3/uL (ref 150–400)
RBC: 2.65 MIL/uL — AB (ref 4.22–5.81)
RDW: 12.6 % (ref 11.5–15.5)
WBC: 5.8 10*3/uL (ref 4.0–10.5)

## 2013-10-11 LAB — IMMUNOFIXATION ELECTROPHORESIS
IGA: 278 mg/dL (ref 68–379)
IgG (Immunoglobin G), Serum: 1460 mg/dL (ref 650–1600)
IgM, Serum: 45 mg/dL — ABNORMAL LOW (ref 41–251)
Total Protein ELP: 7.2 g/dL (ref 6.0–8.3)

## 2013-10-11 LAB — PROTEIN ELECTROPH W RFLX QUANT IMMUNOGLOBULINS
ALBUMIN ELP: 52.7 % — AB (ref 55.8–66.1)
ALPHA-1-GLOBULIN: 5.2 % — AB (ref 2.9–4.9)
Alpha-2-Globulin: 9.7 % (ref 7.1–11.8)
BETA 2: 5.8 % (ref 3.2–6.5)
Beta Globulin: 6.2 % (ref 4.7–7.2)
Gamma Globulin: 20.4 % — ABNORMAL HIGH (ref 11.1–18.8)
M-Spike, %: 0.64 g/dL
Total Protein ELP: 7.2 g/dL (ref 6.0–8.3)

## 2013-10-11 LAB — PROTIME-INR
INR: 1.11 (ref 0.00–1.49)
Prothrombin Time: 14.3 seconds (ref 11.6–15.2)

## 2013-10-11 LAB — BONE MARROW EXAM

## 2013-10-11 MED ORDER — FENTANYL CITRATE 0.05 MG/ML IJ SOLN
INTRAMUSCULAR | Status: AC | PRN
  Administered 2013-10-11 (×3): 50 ug via INTRAVENOUS

## 2013-10-11 MED ORDER — FENTANYL CITRATE 0.05 MG/ML IJ SOLN
INTRAMUSCULAR | Status: AC
  Filled 2013-10-11: qty 4

## 2013-10-11 MED ORDER — MIDAZOLAM HCL 2 MG/2ML IJ SOLN
INTRAMUSCULAR | Status: AC | PRN
  Administered 2013-10-11 (×2): 2 mg via INTRAVENOUS

## 2013-10-11 MED ORDER — MIDAZOLAM HCL 2 MG/2ML IJ SOLN
INTRAMUSCULAR | Status: AC
  Filled 2013-10-11: qty 6

## 2013-10-11 MED ORDER — LIDOCAINE HCL 1 % IJ SOLN
INTRAMUSCULAR | Status: AC
  Filled 2013-10-11: qty 10

## 2013-10-11 NOTE — Progress Notes (Signed)
Reviewed notes i do not recommend perc tubes or stents currently since pt likely has medical renal disease Please call me to discuss if any concerns F/up as outpt

## 2013-10-11 NOTE — Procedures (Signed)
Technically successful CT guided bone marrow aspiration and biopsy of right iliac crest. No immediate complications.

## 2013-10-11 NOTE — Progress Notes (Signed)
Pt c/o heart racing. Afebrile, BP 115/57 HR 91. Baltazar Najjar, NP notified. New orders received for EKG PRN when c/o heart racing. Pt aware to notify staff when pt feels heart racing. Will continue to monitor.

## 2013-10-11 NOTE — Progress Notes (Signed)
AKI due to obstruction, r/o Myeloma as well Plan per GU and oncology. Will need renal follow up if no improvement over time.  Patient is relocating but we can see if needed as outpt.  Will sign off  Subjective: Interval History: s/p BM today  Objective: Vital signs in last 24 hours: Temp:  [98.2 F (36.8 C)-98.9 F (37.2 C)] 98.5 F (36.9 C) (09/01 0928) Pulse Rate:  [82-94] 87 (09/01 1127) Resp:  [13-20] 17 (09/01 1127) BP: (113-147)/(61-84) 146/72 mmHg (09/01 1127) SpO2:  [87 %-100 %] 99 % (09/01 1127) Weight:  [78.2 kg (172 lb 6.4 oz)] 78.2 kg (172 lb 6.4 oz) (08/31 2152) Weight change: 1.089 kg (2 lb 6.4 oz)  Intake/Output from previous day: 08/31 0701 - 09/01 0700 In: 1440 [P.O.:1440] Out: 1750 [Urine:1750] Intake/Output this shift: Total I/O In: 240 [P.O.:240] Out: 975 [Urine:975]  PE unchanged, talkative  Lab Results:  Recent Labs  10/09/13 0549 10/11/13 0616  WBC 5.5 5.8  HGB 8.3* 8.3*  HCT 24.5* 25.6*  PLT 213 223   BMET:  Recent Labs  10/10/13 0405 10/11/13 0616  NA 141 141  K 4.9 5.4*  CL 105 105  CO2 23 23  GLUCOSE 104* 106*  BUN 49* 55*  CREATININE 4.09* 4.00*  CALCIUM 9.1 9.5   No results found for this basename: PTH,  in the last 72 hours Iron Studies: No results found for this basename: IRON, TIBC, TRANSFERRIN, FERRITIN,  in the last 72 hours Studies/Results: Ct Biopsy  10/11/2013   INDICATION: History of testicular cancer, now with concern for multiple myeloma. Please perform bone marrow biopsy and aspiration for tissue diagnostic purposes.  EXAM: CT GUIDED BONE MARROW BIOPSY AND ASPIRATION  MEDICATIONS: Fentanyl 150 mcg IV; Versed 4 mg IV  ANESTHESIA/SEDATION: Sedation Time  10 minutes  CONTRAST:  None  COMPLICATIONS: None immediate.  PROCEDURE: Informed consent was obtained from the patient following an explanation of the procedure, risks, benefits and alternatives. The patient understands, agrees and consents for the procedure. All  questions were addressed. A time out was performed prior to the initiation of the procedure. The patient was positioned prone and non-contrast localization CT was performed of the pelvis to demonstrate the iliac marrow spaces. The operative site was prepped and draped in the usual sterile fashion.  Under sterile conditions and local anesthesia, a 22 gauge spinal needle was utilized for procedural planning. Next, an 11 gauge coaxial bone biopsy needle was advanced into the right iliac marrow space. Needle position was confirmed with CT imaging. Initially, bone marrow aspiration was performed. Next, a bone marrow biopsy was obtained with the 11 gauge outer bone marrow device. Samples were prepared with the cytotechnologist and deemed adequate. The needle was removed intact. Hemostasis was obtained with compression and a dressing was placed. The patient tolerated the procedure well without immediate post procedural complication.  IMPRESSION: Successful CT guided right iliac bone marrow aspiration and core biopsies.   Electronically Signed   By: Sandi Mariscal M.D.   On: 10/11/2013 12:10   Scheduled: . sodium chloride   Intravenous Once  . docusate sodium  100 mg Oral BID  . fentaNYL      . lamoTRIgine  200 mg Oral Daily  . lidocaine      . midazolam      . sodium chloride  3 mL Intravenous Q12H  . sodium polystyrene  50 g Oral Once  . tamsulosin  0.4 mg Oral Daily      LOS:  7 days   Nalda Shackleford C 10/11/2013,5:37 PM

## 2013-10-11 NOTE — Progress Notes (Addendum)
Patient ID: Adam Herring  male  WJX:[id number]    DOB: [birth year]    DOA: 10/04/2013  PCP: Joycelyn Man, MD  Interim summary  Adam Herring is a 63 y.o. male with past medical history of ADD, history of testicular cancer and bipolar disorder. Patient was called from his 75 office on the day of the admission to come to the ED for elevated creatinine and low hemoglobin. Patient was recently placed on  clindamycin initially and then switched to penicillin for sinusitis issues, lithium 2-3 weeks prior to admission, also admitted to taking excess inbuprofen.  In the ER creatinine 6.2, hemoglobin 7.0, potassium is 6.2. Patient was admitted for further work-up.   Nephrology was consulted, patient was placed on IV fluids, w/u was initiated. CT of the abdomen and pelvis showed mild bilateral hydronephrosis and hydroureter, enlarged prostate gland with indentation of the bladder base. Urology was consulted and recommended Flomax, Foley catheter, urine culture showed no growth.   SPEP showed  monoclonal protein/M spike + on the SPEP, hema-onc was consulted. Patient underwent CT guided bone marrow biopsy 9/1.    Assessment/Plan: Principal Problem:   Acute renal failure: From bladder outlet obstruction or possibly drug-induced interstitial nephritis from NSAIDS vs multiple myeloma  - Creatinine plateaued at 4.0, cont foley, likely has component of chronic medical renal disease, ? Multiple myeloma  - CT of the abdomen and pelvis showed mild bilateral hydronephrosis and hydroureter, enlarged prostate gland with indentation of the bladder base - Urology was consulted, continue Flomax, Foley catheter, urine culture showed no growth - Outpatient workup for BPH by urology - monoclonal protein/M spike + on the SPEP, light chains pending, hema-onc consulted, CT guided bone marrow biopsy today  Active Problems: Hyperkalemia - improved  BPH - PSA slightly elevated, 5.76, urology consulted  however PSA inconclusive due to Foley catheterization, outpatient further workup - Started on Flomax    Bipolar depression/ADD - Patient is on Adderall and lithium, reported that lithium started recently within 2-3 weeks   Anemia, unspecified - Hemoglobin stable   DVT Prophylaxis: SCDs  Code Status:FC  Family Communication: Discussed with patient   Disposition:  Consultants:  Nephrology  Urology  Hematology- Dr Benay Spice  Procedures:  CT-guided bone marrow biopsy 9/1  Antibiotics:  None    Subjective: Patient seen and examined after biopsy, doing okay, sleepy, pain controlled   Objective: Weight change: 1.089 kg (2 lb 6.4 oz)  Intake/Output Summary (Last 24 hours) at 10/11/13 1243 Last data filed at 10/11/13 0918  Gross per 24 hour  Intake   1200 ml  Output   2325 ml  Net  -1125 ml   Blood pressure 146/72, pulse 87, temperature 98.5 F (36.9 C), temperature source Oral, resp. rate 17, height 6' 3.5" (1.918 m), weight 78.2 kg (172 lb 6.4 oz), SpO2 99.00%.  Physical Exam: General: A x O x3 CVS: S1-S2 clear Chest: CTAB Abdomen: soft NT, ND, NBS Extremities: no c/c/e bilaterally   Lab Results: Basic Metabolic Panel:  Recent Labs Lab 10/10/13 0405 10/11/13 0616  NA 141 141  K 4.9 5.4*  CL 105 105  CO2 23 23  GLUCOSE 104* 106*  BUN 49* 55*  CREATININE 4.09* 4.00*  CALCIUM 9.1 9.5  PHOS 4.7* 4.8*   Liver Function Tests:  Recent Labs Lab 10/05/13 0554  10/10/13 0405 10/11/13 0616  AST 14  --   --   --   ALT 8  --   --   --  ALKPHOS 57  --   --   --   BILITOT 0.3  --   --   --   PROT 7.2  --   --   --   ALBUMIN 3.6  < > 3.4* 3.4*  < > = values in this interval not displayed. No results found for this basename: LIPASE, AMYLASE,  in the last 168 hours No results found for this basename: AMMONIA,  in the last 168 hours CBC:  Recent Labs Lab 10/09/13 0549 10/11/13 0616  WBC 5.5 5.8  NEUTROABS 3.0  --   HGB 8.3* 8.3*  HCT 24.5*  25.6*  MCV 95.7 96.6  PLT 213 223   Cardiac Enzymes: No results found for this basename: CKTOTAL, CKMB, CKMBINDEX, TROPONINI,  in the last 168 hours BNP: No components found with this basename: POCBNP,  CBG: No results found for this basename: GLUCAP,  in the last 168 hours   Micro Results: Recent Results (from the past 240 hour(s))  URINE CULTURE     Status: None   Collection Time    10/06/13 12:40 AM      Result Value Ref Range Status   Specimen Description URINE, CATHETERIZED   Final   Special Requests NONE   Final   Culture  Setup Time     Final   Value: 10/06/2013 01:29     Performed at [place]     Final   Value: NO GROWTH     Performed at [facility]   Culture     Final   Value: NO GROWTH     Performed at [facility]   Report Status 10/07/2013 FINAL   Final    Studies/Results: US Renal  10/04/2013   CLINICAL DATA:  Acute renal failure.  History of testicular cancer.  EXAM: RENAL/URINARY TRACT ULTRASOUND COMPLETE  COMPARISON:  None.  FINDINGS: Right Kidney:  Length: 11.0 cm. Echogenicity within normal limits. Mild dilatation of the collecting system.  Left Kidney:  Length: 10.9 cm. Echogenicity within normal limits. No hydronephrosis visualized. 1.0 x 0.9 x 0.8 cm rounded, exophytic, hypoechoic mass arising from the midportion of the kidney.  Bladder:  Not visualized. Foley catheter in place. There is a 4.6 x 4.1 x 3.9 cm rounded mass like area in the expected position of the urinary bladder.  IMPRESSION: 1. Mild right hydronephrosis. 2. 1.0 cm probable mildly complex exophytic cyst arising from the midportion of the left kidney. A solid mass cannot be excluded. This could be better determined with an elective, outpatient pre and postcontrast magnetic resonance imaging examination of the kidneys. 3. Probable moderately enlarged prostate gland in the inferior pelvis. A bladder mass is less likely.   Electronically Signed   By: Enrique Sack M.D.   On: 10/04/2013 21:00    Medications: Scheduled Meds: . sodium chloride   Intravenous Once  . docusate sodium  100 mg Oral BID  . fentaNYL      . lamoTRIgine  200 mg Oral Daily  . lidocaine      . midazolam      . sodium chloride  3 mL Intravenous Q12H  . sodium polystyrene  50 g Oral Once  . tamsulosin  0.4 mg Oral Daily      LOS: 7 days   RAI,RIPUDEEP M.D. [facility] 10/11/2013, 12:43 PM Pager: [phone]  If 7PM-7AM, please contact night-coverage [email or web address] Password [email or web address]  **Disclaimer: This note was dictated with voice recognition software.  Similar sounding words can inadvertently be transcribed and this note may contain transcription errors which may not have been corrected upon publication of note.**

## 2013-10-11 NOTE — Progress Notes (Signed)
Labs reviewed. Plan for bone marrow biopsy this a.m. We will followup on the bone marrow pathology and make additional recommendations. 

## 2013-10-12 ENCOUNTER — Inpatient Hospital Stay (HOSPITAL_COMMUNITY): Payer: BC Managed Care – PPO

## 2013-10-12 DIAGNOSIS — R339 Retention of urine, unspecified: Secondary | ICD-10-CM

## 2013-10-12 DIAGNOSIS — N401 Enlarged prostate with lower urinary tract symptoms: Secondary | ICD-10-CM | POA: Diagnosis present

## 2013-10-12 DIAGNOSIS — R338 Other retention of urine: Secondary | ICD-10-CM | POA: Diagnosis present

## 2013-10-12 DIAGNOSIS — C7952 Secondary malignant neoplasm of bone marrow: Secondary | ICD-10-CM

## 2013-10-12 DIAGNOSIS — D472 Monoclonal gammopathy: Secondary | ICD-10-CM | POA: Diagnosis present

## 2013-10-12 DIAGNOSIS — C7951 Secondary malignant neoplasm of bone: Secondary | ICD-10-CM

## 2013-10-12 MED ORDER — SODIUM CHLORIDE 0.9 % IV SOLN
INTRAVENOUS | Status: DC
  Administered 2013-10-12: 1000 mL via INTRAVENOUS
  Administered 2013-10-13: 125 mL via INTRAVENOUS
  Administered 2013-10-13 – 2013-10-14 (×2): via INTRAVENOUS

## 2013-10-12 NOTE — Progress Notes (Signed)
PROGRESS NOTE  Adam Herring ONG:[id number] DOB: 03-Jun-1950 DOA: 10/04/2013 PCP: Joycelyn Man, MD  HPI/Recap of past 16 hours: 63 year old [name] with past medical history of bipolar disorder and treated testicular cancer sent over from his primary care physician's office on 8/25 4 low hemoglobin and elevated creatinine, confirmed in the emergency room with hemoglobin of 7.0, creatinine of 6.2 and also potassium of 6.2. Patient was admitted to the hospitalist service for further workup.  During workup, patient was noted to have bilateral hydronephrosis and enlarged prostate. He was also noted to have a monoclonal protein M spike on serum protein electrophoresis. Urology placed a Foley catheter and started patient on Flomax and in that time, patient's creatinine has started to improve. Patient was seen by oncology as per recommendations underwent CT-guided bone marrow biopsy 9/2 with results pending.  Assessment/Plan: Principal Problem:   Acute renal failure: Minimal change from previous day with elevated BUN. I have started IV fluids hopefully this will improve patient's kidney function. Cause felt to be secondary to BPH causing urinary retention. Active Problems:   Bipolar depression: Continue home meds. Lithium which she started recently not felt to be cause of renal failure   ADHD (attention deficit hyperactivity disorder)    Anemia, unspecified: Unclear etiology. No signs of active bleeding. Felt to be secondary to anemia of chronic disease? Patient status post 1 unit packed red blood cells on admission and hemoglobin has remained stable around 8.3 since    Acute urinary retention: Resolved with placement of Foley catheter.    BPH (benign prostatic hypertrophy) with urinary retention: Elevated PSA, although cannot completely rule out prostate malignancy at this time. Plan will be to continue patient on Flomax and followup with urology as outpatient.    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 Not felt to be cause of patient's renal failure. Status post bone marrow biopsy to determine his multiple myeloma versus MGUS   Code Status: Full code  Family Communication: Patient declined for me to call the family at this time  Disposition Plan: Awaiting determination of monoclonal spike. Discharge hopefully next one to 2 days   Consultants:  Nephrology  Urology  Oncology  Procedures:  Status post bone marrow biopsy done 9/2  Placement of Foley catheter on 8/29  Antibiotics:  None  HPI/Subjective: Patient doing okay. No complaints. Anxious to know what the biopsy results are  Objective: BP 106/62  Pulse 91  Temp(Src) 98.3 F (36.8 C) (Oral)  Resp 16  Ht 6' 3.5" (1.918 m)  Wt 78.2 kg (172 lb 6.4 oz)  BMI 21.26 kg/m2  SpO2 99%  Intake/Output Summary (Last 24 hours) at 10/12/13 1522 Last data filed at 10/12/13 1510  Gross per 24 hour  Intake    960 ml  Output   2100 ml  Net  -1140 ml   Filed Weights   10/08/13 2057 10/09/13 2031 10/10/13 2152  Weight: 77.111 kg (170 lb) 77.111 kg (170 lb) 78.2 kg (172 lb 6.4 oz)    Exam:   General:  Alert and oriented x3, no acute distress  Cardiovascular: Regular rate and rhythm, S1 and S2  Respiratory: Clear to auscultation bilaterally  Abdomen: Soft, nontender, nondistended, positive bowel sounds  Musculoskeletal: No clubbing or cyanosis or edema   Data Reviewed: Basic Metabolic Panel:  Recent Labs Lab 10/07/13 0535 10/08/13 0420 10/09/13 0549 10/10/13 0405 10/11/13 0616  NA 139 140 136* 141 141  K 5.4* 5.1 5.2 4.9 5.4*  CL 106 104 101 105 105  CO2 20 24 25 23 23   GLUCOSE 113* 98 107* 104* 106*  BUN 44* 43* 45* 49* 55*  CREATININE 4.34* 4.01* 3.96* 4.09* 4.00*  CALCIUM 9.1 9.2 9.4 9.1 9.5  PHOS  --   --  4.8* 4.7* 4.8*   Liver Function Tests:  Recent Labs Lab 10/09/13 0549 10/10/13 0405 10/11/13 0616  ALBUMIN 3.3* 3.4*  3.4*   No results found for this basename: LIPASE, AMYLASE,  in the last 168 hours No results found for this basename: AMMONIA,  in the last 168 hours CBC:  Recent Labs Lab 10/09/13 0549 10/11/13 0616  WBC 5.5 5.8  NEUTROABS 3.0  --   HGB 8.3* 8.3*  HCT 24.5* 25.6*  MCV 95.7 96.6  PLT 213 223       Scheduled Meds: . sodium chloride   Intravenous Once  . docusate sodium  100 mg Oral BID  . lamoTRIgine  200 mg Oral Daily  . sodium chloride  3 mL Intravenous Q12H  . sodium polystyrene  50 g Oral Once  . tamsulosin  0.4 mg Oral Daily    Continuous Infusions: . sodium chloride       Time spent: 25 minutes  [facility] Pager [phone]. If 7PM-7AM, please contact night-coverage at [email or web address], password [facility] 10/12/2013, 3:22 PM  LOS: 8 days

## 2013-10-12 NOTE — Progress Notes (Addendum)
IP PROGRESS NOTE  Subjective:   He appears unchanged. No new complaint. He reports tolerating the bone marrow procedure well.  Objective: Vital signs in last 24 hours: Blood pressure 106/62, pulse 91, temperature 98.3 F (36.8 C), temperature source Oral, resp. rate 16, height 6' 3.5" (1.918 m), weight 172 lb 6.4 oz (78.2 kg), SpO2 99.00%.  Intake/Output from previous day: 09/01 0701 - 09/02 0700 In: 1200 [P.O.:1200] Out: 2175 [Urine:2175]  Physical Exam:  Bone marrow site without bleeding. Extremities: No leg edema    Lab Results:  Recent Labs  10/11/13 0616  WBC 5.8  HGB 8.3*  HCT 25.6*  PLT 223    BMET  Recent Labs  10/10/13 0405 10/11/13 0616  NA 141 141  K 4.9 5.4*  CL 105 105  CO2 23 23  GLUCOSE 104* 106*  BUN 49* 55*  CREATININE 4.09* 4.00*  CALCIUM 9.1 9.5    Studies/Results: Ct Biopsy  10/11/2013   INDICATION: History of testicular cancer, now with concern for multiple myeloma. Please perform bone marrow biopsy and aspiration for tissue diagnostic purposes.  EXAM: CT GUIDED BONE MARROW BIOPSY AND ASPIRATION  MEDICATIONS: Fentanyl 150 mcg IV; Versed 4 mg IV  ANESTHESIA/SEDATION: Sedation Time  10 minutes  CONTRAST:  None  COMPLICATIONS: None immediate.  PROCEDURE: Informed consent was obtained from the patient following an explanation of the procedure, risks, benefits and alternatives. The patient understands, agrees and consents for the procedure. All questions were addressed. A time out was performed prior to the initiation of the procedure. The patient was positioned prone and non-contrast localization CT was performed of the pelvis to demonstrate the iliac marrow spaces. The operative site was prepped and draped in the usual sterile fashion.  Under sterile conditions and local anesthesia, a 22 gauge spinal needle was utilized for procedural planning. Next, an 11 gauge coaxial bone biopsy needle was advanced into the right iliac marrow space. Needle  position was confirmed with CT imaging. Initially, bone marrow aspiration was performed. Next, a bone marrow biopsy was obtained with the 11 gauge outer bone marrow device. Samples were prepared with the cytotechnologist and deemed adequate. The needle was removed intact. Hemostasis was obtained with compression and a dressing was placed. The patient tolerated the procedure well without immediate post procedural complication.  IMPRESSION: Successful CT guided right iliac bone marrow aspiration and core biopsies.   Electronically Signed   By: Sandi Mariscal M.D.   On: 10/11/2013 12:10    Medications: I have reviewed the patient's current medications.  Assessment/Plan:  1. Acute renal failure 2. The serum/urine monoclonal IgG kappa protein, elevated serum kappa and lambda light chains 3. Anemia 4. prostatic hypertrophy with bladder outlet obstruction, Foley catheter in place 5. history of a seminoma 6. bipolar disease 7. ADD  He appears unchanged. He underwent a bone marrow biopsy yesterday. If the bone marrow biopsy confirms multiple myeloma I recommend proceeding with Velcade/Decadron therapy. I reviewed the potential toxicities associated with this regimen including the chance for nausea/vomiting, diarrhea, hematologic toxicities, atypical infection, thromboembolic disease, and neuropathy. He agrees to proceed. He will be placed on prophylactic acyclovir if he starts Velcade therapy.  If the bone marrow biopsy is not diagnostic of multiple myeloma he will need additional evaluation of the renal failure per nephrology. He will also be a candidate for erythropoietin therapy for treatment of the anemia.  Recommendations: 1. Followup bone marrow biopsy and initiate systemic therapy if a diagnosis of multiple myeloma is confirmed 2. obtain  metastatic bone survey if a diagnosis of multiple myeloma is confirmed 3. management of renal failure per nephrology  I will be out starting 10/13/2013. Dr.  Marin Olp will see him beginning 10/13/2013. We will schedule outpatient followup.   LOS: 8 days   Betsy Coder  10/12/2013, 10:58 AM  Addendum: Dr. Gari Crown reports preliminary review of the bone marrow aspirate and biopsy reveals 9% plasma cells with some atypical forms. He plans to perform additional stains to confirm clonality and look for evidence of amyloidosis.  He does not have overt multiple myeloma in the bone marrow. I will order a metastatic bone survey. I will discuss the case with nephrology regarding the indication for a renal biopsy

## 2013-10-13 LAB — CBC
HCT: 22.9 % — ABNORMAL LOW (ref 39.0–52.0)
HEMOGLOBIN: 7.6 g/dL — AB (ref 13.0–17.0)
MCH: 31.5 pg (ref 26.0–34.0)
MCHC: 33.2 g/dL (ref 30.0–36.0)
MCV: 95 fL (ref 78.0–100.0)
Platelets: 220 10*3/uL (ref 150–400)
RBC: 2.41 MIL/uL — ABNORMAL LOW (ref 4.22–5.81)
RDW: 12.4 % (ref 11.5–15.5)
WBC: 4.9 10*3/uL (ref 4.0–10.5)

## 2013-10-13 LAB — COMPREHENSIVE METABOLIC PANEL
ALK PHOS: 54 U/L (ref 39–117)
ALT: 10 U/L (ref 0–53)
AST: 14 U/L (ref 0–37)
Albumin: 3.1 g/dL — ABNORMAL LOW (ref 3.5–5.2)
Anion gap: 12 (ref 5–15)
BUN: 51 mg/dL — AB (ref 6–23)
CO2: 22 mEq/L (ref 19–32)
Calcium: 9.1 mg/dL (ref 8.4–10.5)
Chloride: 108 mEq/L (ref 96–112)
Creatinine, Ser: 3.63 mg/dL — ABNORMAL HIGH (ref 0.50–1.35)
GFR calc Af Amer: 19 mL/min — ABNORMAL LOW (ref >90)
GFR calc non Af Amer: 16 mL/min — ABNORMAL LOW (ref >90)
Glucose, Bld: 91 mg/dL (ref 70–99)
POTASSIUM: 5.6 meq/L — AB (ref 3.7–5.3)
Sodium: 142 mEq/L (ref 137–147)
Total Bilirubin: <0.2 mg/dL — ABNORMAL LOW (ref 0.3–1.2)
Total Protein: 6.7 g/dL (ref 6.0–8.3)

## 2013-10-13 LAB — BASIC METABOLIC PANEL
Anion gap: 11 (ref 5–15)
BUN: 56 mg/dL — ABNORMAL HIGH (ref 6–23)
CALCIUM: 9 mg/dL (ref 8.4–10.5)
CO2: 22 mEq/L (ref 19–32)
CREATININE: 3.9 mg/dL — AB (ref 0.50–1.35)
Chloride: 108 mEq/L (ref 96–112)
GFR calc Af Amer: 17 mL/min — ABNORMAL LOW (ref >90)
GFR calc non Af Amer: 15 mL/min — ABNORMAL LOW (ref >90)
GLUCOSE: 101 mg/dL — AB (ref 70–99)
Potassium: 5.5 mEq/L — ABNORMAL HIGH (ref 3.7–5.3)
Sodium: 141 mEq/L (ref 137–147)

## 2013-10-13 MED ORDER — LORATADINE 10 MG PO TABS
10.0000 mg | ORAL_TABLET | Freq: Every day | ORAL | Status: DC
  Administered 2013-10-13 – 2013-10-14 (×2): 10 mg via ORAL
  Filled 2013-10-13 (×2): qty 1

## 2013-10-13 NOTE — Progress Notes (Signed)
I spoke to pt and offered follow up in [place], but he is moving to [facility].   He should follow up with urology and if needed get nephrology follow up.   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 Adam Herring

## 2013-10-13 NOTE — Progress Notes (Signed)
PROGRESS NOTE  Adam Herring ZOX:[id number] DOB: 1950-08-09 DOA: 10/04/2013 PCP: Joycelyn Man, MD  HPI/Recap of past 54 hours: 63 year old [name] with past medical history of bipolar disorder and treated testicular cancer sent over from his primary care physician's office on 8/25 4 low hemoglobin and elevated creatinine, confirmed in the emergency room with hemoglobin of 7.0, creatinine of 6.2 and also potassium of 6.2. Patient was admitted to the hospitalist service for further workup.  During workup, patient was noted to have bilateral hydronephrosis and enlarged prostate. He was also noted to have a monoclonal protein M spike on serum protein electrophoresis. Urology placed a Foley catheter and started patient on Flomax and in that time, patient's creatinine has started to improve. Patient was seen by oncology as per recommendations underwent CT-guided bone marrow biopsy 9/2.  Creatinine slightly down from previous day. Patient feeling okay. 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 I have started IV fluids hopefully this will improve patient's kidney function. Cause felt to be secondary to BPH causing urinary retention. Creatinine down slightly today at 3.9. Had increased IV fluids and recheck his metabolic panel tomorrow Active Problems:   Bipolar depression: Continue home meds. Lithium which she started recently not felt to be cause of renal failure   ADHD (attention deficit hyperactivity disorder)    Anemia, unspecified: Unclear etiology. No signs of active bleeding. Felt to be secondary to anemia of chronic disease? Patient status post 1 unit packed red blood cells on admission and hemoglobin has remained stable around 8.3 since   Acute urinary retention: Resolved with placement of Foley catheter.    BPH (benign prostatic hypertrophy) with urinary retention: Elevated PSA, although cannot completely rule out prostate malignancy at this time. Plan will be to continue patient on Flomax and followup with urology as outpatient.    Monoclonal gammopathies: Incidentally found during workup for renal function. Not felt to be cause of patient's renal failure. Status post bone marrow biopsy to determine his multiple myeloma versus MGUS. 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 Oncology recommends concern that sampling may be inadequate. Urine sample taken for urine protein of diaphoresis rather than 24-hour was only a spot sample. Have ordered 24-hour urine collection.   Code Status: Full code  Family Communication: Patient declined for me to call the family at this time  Disposition Plan: Hopefully home tomorrow. Ideally, creatinine will significantly improve with increased IV fluids prior to discharge. Also awaiting completion of 24 hour urine collection   Consultants:  Nephrology  Urology  Oncology  Procedures:  Status post bone marrow biopsy done 9/2  Placement of Foley catheter on 8/29  Antibiotics:  None  HPI/Subjective: Patient complains of some mild congestion. Otherwise feeling okay.  Objective: BP 128/75  Pulse 74  Temp(Src) 97.4 F (36.3 C) (Oral)  Resp 18  Ht 6' 3.5" (1.918 m)  Wt 76.3 kg (168 lb 3.4 oz)  BMI 20.74 kg/m2  SpO2 100%  Intake/Output Summary (Last 24 hours) at 10/13/13 1452 Last data filed at 10/13/13 1002  Gross per 24 hour  Intake 2298.33 ml  Output   2725 ml  Net -426.67 ml   Filed Weights   10/09/13 2031 10/10/13 2152 10/12/13 2125  Weight: 77.111  kg (170 lb) 78.2 kg (172 lb 6.4 oz) 76.3 kg (168 lb 3.4 oz)    Exam:   General:  Alert and oriented x3, mildly congested  Cardiovascular: Regular rate and rhythm, S1  and S2  Respiratory: Clear to auscultation bilaterally  Abdomen: Soft, nontender, nondistended, positive bowel sounds  Musculoskeletal: No clubbing or cyanosis or edema   Data Reviewed: Basic Metabolic Panel:  Recent Labs Lab 10/09/13 0549 10/10/13 0405 10/11/13 0616 10/13/13 0542 10/13/13 0925  NA 136* 141 141 141 142  K 5.2 4.9 5.4* 5.5* 5.6*  CL 101 105 105 108 108  CO2 _0 GLUCOSE 107* 104* 106* 101* 91  BUN 45* 49* 55* 56* 51*  CREATININE 3.96* 4.09* 4.00* 3.90* 3.63*  CALCIUM 9.4 9.1 9.5 9.0 9.1  PHOS 4.8* 4.7* 4.8*  --   --    Liver Function Tests:  Recent Labs Lab 10/09/13 0549 10/10/13 0405 10/11/13 0616 10/13/13 0925  AST  --   --   --  14  ALT  --   --   --  10  ALKPHOS  --   --   --  54  BILITOT  --   --   --  <0.2*  PROT  --   --   --  6.7  ALBUMIN 3.3* 3.4* 3.4* 3.1*   No results found for this basename: LIPASE, AMYLASE,  in the last 168 hours No results found for this basename: AMMONIA,  in the last 168 hours CBC:  Recent Labs Lab 10/09/13 0549 10/11/13 0616 10/13/13 0925  WBC 5.5 5.8 4.9  NEUTROABS 3.0  --   --   HGB 8.3* 8.3* 7.6*  HCT 24.5* 25.6* 22.9*  MCV 95.7 96.6 95.0  PLT 213 223 220       Scheduled Meds: . sodium chloride   Intravenous Once  . docusate sodium  100 mg Oral BID  . lamoTRIgine  200 mg Oral Daily  . loratadine  10 mg Oral Daily  . sodium chloride  3 mL Intravenous Q12H  . sodium polystyrene  50 g Oral Once  . tamsulosin  0.4 mg Oral Daily    Continuous Infusions: . sodium chloride 125 mL (10/13/13 1026)     Time spent: 25 minutes  [facility] Pager [phone]. If 7PM-7AM, please contact night-coverage at [email or web address], password [facility] 10/13/2013, 2:52 PM  LOS: 9 days

## 2013-10-13 NOTE — Progress Notes (Signed)
[name] is doing well this AM. We are still awaiting the results of the bone marrow test. The preliminary shows 9% plasma cells. Again, I think that overt myeloma would be highly unlikely.  A bone survey was done. This was negative for any myeloma in the bones.  He's had a CT scan. None is noted on the bones with a CT scan.  His actual myeloma studies,I think, are more reflective of MGUS. His a normal IgA level. His IgM is barely below normal. He has a minimally elevated Kappa light chain. His lambda light chain is not decreased. The ratio of light chains is pretty much normal.  He was wondering about the possibility of lupus. This certainly would be interesting to check.  I would also make sure that he is checked for hepatitis and HIV. These were done. They all came back normal.  I suspect that if his renal function does not improve, that he is going to need a kidney biopsy.  He does need a 24-hour urine test. I think that the urine immunofixation that was done was only on a random urine sample. Again, I be shocked if he had any type of light chain issue. Typically, light chain disease you often see significant bone involvement on bone survey.  On his physical exam, this is pretty much unremarkable. All his vital signs are stable. Blood pressure 130/72. Temperature 98.7. Pulse is 88. Lungs are clear. Cardiac exam regular in rhythm. Abdomen soft. There is no palpable liver or spleen tip.  Again, I be shocked if he ends up with myeloma. 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 There are no labs back yet today.  I suspect that his anemia probably is on the basis of erythropoietin deficiency.  Adam E.  1 Corinthians 15:58

## 2013-10-14 DIAGNOSIS — R338 Other retention of urine: Secondary | ICD-10-CM

## 2013-10-14 LAB — CBC
HCT: 22.1 % — ABNORMAL LOW (ref 39.0–52.0)
Hemoglobin: 7.2 g/dL — ABNORMAL LOW (ref 13.0–17.0)
MCH: 31 pg (ref 26.0–34.0)
MCHC: 32.6 g/dL (ref 30.0–36.0)
MCV: 95.3 fL (ref 78.0–100.0)
Platelets: 208 10*3/uL (ref 150–400)
RBC: 2.32 MIL/uL — ABNORMAL LOW (ref 4.22–5.81)
RDW: 12.5 % (ref 11.5–15.5)
WBC: 5.4 10*3/uL (ref 4.0–10.5)

## 2013-10-14 LAB — COMPREHENSIVE METABOLIC PANEL
ALT: 9 U/L (ref 0–53)
ANION GAP: 10 (ref 5–15)
AST: 12 U/L (ref 0–37)
Albumin: 3 g/dL — ABNORMAL LOW (ref 3.5–5.2)
Alkaline Phosphatase: 56 U/L (ref 39–117)
BUN: 50 mg/dL — AB (ref 6–23)
CO2: 22 mEq/L (ref 19–32)
Calcium: 8.8 mg/dL (ref 8.4–10.5)
Chloride: 110 mEq/L (ref 96–112)
Creatinine, Ser: 3.52 mg/dL — ABNORMAL HIGH (ref 0.50–1.35)
GFR calc Af Amer: 20 mL/min — ABNORMAL LOW (ref >90)
GFR, EST NON AFRICAN AMERICAN: 17 mL/min — AB (ref >90)
GLUCOSE: 96 mg/dL (ref 70–99)
Potassium: 5.4 mEq/L — ABNORMAL HIGH (ref 3.7–5.3)
Sodium: 142 mEq/L (ref 137–147)
Total Protein: 6.3 g/dL (ref 6.0–8.3)

## 2013-10-14 LAB — PROTEIN, URINE, 24 HOUR
COLLECTION INTERVAL-UPROT: 24 h
Protein, 24H Urine: 510 mg/d — ABNORMAL HIGH (ref 50–100)
Protein, Urine: 17 mg/dL
Urine Total Volume-UPROT: 3000 mL

## 2013-10-14 LAB — ANTI-DNA ANTIBODY, DOUBLE-STRANDED: ds DNA Ab: 2 IU/mL

## 2013-10-14 LAB — ANA: ANA: NEGATIVE

## 2013-10-14 MED ORDER — TAMSULOSIN HCL 0.4 MG PO CAPS: 0.4000 mg | ORAL_CAPSULE | Freq: Every day | ORAL | Status: AC

## 2013-10-14 MED ORDER — CIPROFLOXACIN HCL 500 MG PO TABS: 500.0000 mg | ORAL_TABLET | ORAL | Status: AC

## 2013-10-14 NOTE — Discharge Summary (Signed)
Discharge Summary  Adam Herring EUM:[id number] DOB: 20-Jul-1950  PCP: Joycelyn Man, MD  Admit date: 10/04/2013 Discharge date: 10/14/2013  Time spent: 25 minutes  Recommendations for Outpatient Follow-up:  1.  Patient will followup with urology next week in regards to his Foley catheter 2. Patient will followup with Dr. Marin Olp next week in regards to lab results-specifically his 24-hour quantitative urine protein electrophoresis 3. medication change: Patient advised to avoid NSAIDs 4. New medications: Flomax 0.4 mg by mouth daily 5. New medication: Cipro 250 mg every 18 hours x1 week (renally dosed ) until he sees urology  Discharge Diagnoses:  Active Hospital Problems   Diagnosis Date Noted  . Acute renal failure 10/04/2013  . Acute urinary retention 10/12/2013  . BPH (benign prostatic hypertrophy) with urinary retention 10/12/2013  . Monoclonal gammopathies 10/12/2013  . Anemia, unspecified 09/29/2013  . ADHD (attention deficit hyperactivity disorder) 04/14/2011  . Bipolar depression 04/02/2011    Resolved Hospital Problems   Diagnosis Date Noted Date Resolved  No resolved problems to display.    Discharge Condition:  improved, being discharged home  Diet recommendation:  regular  Filed Weights   10/10/13 2152 10/12/13 2125 10/13/13 2134  Weight: 78.2 kg (172 lb 6.4 oz) 76.3 kg (168 lb 3.4 oz) 80.332 kg (177 lb 1.6 oz)    History of present illness:  63 year old [name] with past medical history of bipolar disorder and treated testicular cancer sent over from his primary care physician's office on 8/25 for low hemoglobin and elevated creatinine, confirmed in the emergency room with hemoglobin of 7.0, creatinine of 6.2 and also potassium of 6.2. Patient was admitted to the hospitalist service for further workup.   Hospital Course:  Principal Problem:   Acute renal failure: Patient underwent extensive workup. He was noted to have bilateral  hydronephrosis and large prostate. Urology placed a Foley catheter and start the patient on Flomax. Over the next 2 days, patient's creatinine has continued to improve and by day of discharge down to 3.5. Plan will be for patient to keep a catheter in and followup with urology as outpatient. Active Problems:   Bipolar depression: Patient continued on home meds. Lithium which he had started recently not felt because of his renal failure.    ADHD (attention deficit hyperactivity disorder): Patient will continue on Adderall as outpatient.    Anemia, unspecified: No signs of active bleeding, this is felt to be more secondary to anemia of chronic disease. Patient is status post 1 unit packed red blood cells on admission and his hemoglobin had peaked as high as 8.3. Over the next few days, it has trended down slightly and was 7.2 on discharge. Patient self was asymptomatic and not short of breath. He's had no bleeding episodes. Felt to be stable for discharge    BPH (benign prostatic hypertrophy) with urinary retention: As described above. Prostate malignancy cannot be completely ruled out at this time. Patient will follow up with urology. Continue Flomax. He will also be placed on antibiotic therapy to prevent UTI while Foley catheter in place    Monoclonal gammopathies: Incidentally found during workup for renal function, patient had elevated protein electrophoresis. This was a spot sample and not 24 urine collection. Patient underwent bone marrow biopsy as well as skeletal survey all of which was unrevealing for any signs of multiple myeloma. Oncology was consulted who felt more likely this was MG [place]. For complete thoroughness a 24-hour urine sample was taken on 9/3 completed 9/4 which  patient will followup with. Oncology however strongly feels that this is not multiple myeloma.   Procedures:   status post bone marrow biopsy done 9/2  Placement of Foley catheter on 8/29  Consultations:    urology  Oncology  Discharge Exam: BP 111/69  Pulse 94  Temp(Src) 97.8 F (36.6 C) (Oral)  Resp 20  Ht 6' 3.5" (1.918 m)  Wt 80.332 kg (177 lb 1.6 oz)  BMI 21.84 kg/m2  SpO2 100%  General:  alert and oriented x3, no acute distress Cardiovascular:  regular rate and rhythm, S1-S2 Respiratory:  clear to auscultation bilaterally  Discharge Instructions You were cared for by a hospitalist during your hospital stay. If you have any questions about your discharge medications or the care you received while you were in the hospital after you are discharged, you can call the unit and asked to speak with the hospitalist on call if the hospitalist that took care of you is not available. Once you are discharged, your primary care physician will handle any further medical issues. Please note that NO REFILLS for any discharge medications will be authorized once you are discharged, as it is imperative that you return to your primary care physician (or establish a relationship with a primary care physician if you do not have one) for your aftercare needs so that they can reassess your need for medications and monitor your lab values.  Discharge Instructions   Diet - low sodium heart healthy    Complete by:  As directed      Increase activity slowly    Complete by:  As directed             Medication List    STOP taking these medications       ibuprofen 200 MG tablet  Commonly known as:  ADVIL,MOTRIN      TAKE these medications       albuterol 108 (90 BASE) MCG/ACT inhaler  Commonly known as:  PROVENTIL HFA;VENTOLIN HFA  Inhale 2 puffs into the lungs every 6 (six) hours as needed.     amphetamine-dextroamphetamine 30 MG tablet  Commonly known as:  ADDERALL  Take 15-30 mg by mouth 2 (two) times daily as needed (for aniexty).     b complex vitamins tablet  Take 1 tablet by mouth daily.     ciprofloxacin 500 MG tablet  Commonly known as:  CIPRO  Take 1 tablet (500 mg total) by mouth  every 18 (eighteen) hours.     ferrous sulfate 325 (65 FE) MG tablet  Take 325 mg by mouth daily with breakfast.     lamoTRIgine 200 MG tablet  Commonly known as:  LAMICTAL  Take 200 mg by mouth daily.     LITHIUM PO  Take 1 capsule by mouth 3 (three) times daily as needed (for aniexty). Has samples from Dr     tamsulosin 0.4 MG Caps capsule  Commonly known as:  FLOMAX  Take 1 capsule (0.4 mg total) by mouth daily.       Allergies  Allergen Reactions  . Dust Mite Extract Shortness Of Breath  . Pollen Extract Shortness Of Breath  . Bean Pod Extract Other (See Comments)    Eczema   . Other Other (See Comments)    Potato's - eczema   . Sesame Oil Other (See Comments)    Severe sweating   . Tomato Other (See Comments)    Eczema       The results of significant diagnostics  from this hospitalization (including imaging, microbiology, ancillary and laboratory) are listed below for reference.    Significant Diagnostic Studies: Ct Abdomen Pelvis Wo Contrast  10/05/2013   CLINICAL DATA:  Hydronephrosis, renal mass  EXAM: CT ABDOMEN AND PELVIS WITHOUT CONTRAST  TECHNIQUE: Multidetector CT imaging of the abdomen and pelvis was performed following the standard protocol without IV contrast.  COMPARISON:  Renal ultrasound 10/04/2013  FINDINGS: Sagittal images of the spine are unremarkable.  Unenhanced liver shows no biliary ductal dilatation. Gallbladder is contracted without evidence of calcified gallstones.  The study is limited without IV contrast. The pancreas spleen and adrenals are unremarkable. There is bilateral mild hydronephrosis and mild hydroureter. No calcified renal or ureteral calculi are identified. There is a exophytic probable cyst in midpole of the left kidney measures 9.4 mm and 10.3 Hounsfield units in attenuation.  Moderate colonic stool.  No small bowel obstruction. No ascites or free air. No adenopathy. There is a enlarged prostate gland with indentation of urinary  bladder base. Prostate gland measures 5.9 x 4.7 cm. A Foley catheter is noted within a decompressed urinary bladder. Small amount of air within bladder is probable post instrumentation. There is significant thickening of urinary bladder wall. This may be due to chronic inflammation or cystitis. Clinical correlation is necessary. No calcified calculi are noted within depressed urinary bladder.  IMPRESSION: 1. There is mild bilateral hydronephrosis and hydroureter. Probable cyst in midpole of the left kidney measures 9.4 mm. 2. No nephrolithiasis.  No calcified ureteral calculi. 3. Moderate colonic stool. 4. No small bowel or colonic obstruction. 5. There is enlarged prostate gland with indentation of the bladder base. Significant thickening of urinary bladder wall. This may be due to cystitis or chronic inflammation. Neoplastic process cannot be excluded. Correlation with urology exam is recommended.   Electronically Signed   By: Lahoma Crocker M.D.   On: 10/05/2013 13:37   US Renal  10/04/2013   CLINICAL DATA:  Acute renal failure.  History of testicular cancer.  EXAM: RENAL/URINARY TRACT ULTRASOUND COMPLETE  COMPARISON:  None.  FINDINGS: Right Kidney:  Length: 11.0 cm. Echogenicity within normal limits. Mild dilatation of the collecting system.  Left Kidney:  Length: 10.9 cm. Echogenicity within normal limits. No hydronephrosis visualized. 1.0 x 0.9 x 0.8 cm rounded, exophytic, hypoechoic mass arising from the midportion of the kidney.  Bladder:  Not visualized. Foley catheter in place. There is a 4.6 x 4.1 x 3.9 cm rounded mass like area in the expected position of the urinary bladder.  IMPRESSION: 1. Mild right hydronephrosis. 2. 1.0 cm probable mildly complex exophytic cyst arising from the midportion of the left kidney. A solid mass cannot be excluded. This could be better determined with an elective, outpatient pre and postcontrast magnetic resonance imaging examination of the kidneys. 3. Probable moderately  enlarged prostate gland in the inferior pelvis. A bladder mass is less likely.   Electronically Signed   By: Enrique Sack M.D.   On: 10/04/2013 21:00   Ct Biopsy  10/11/2013   INDICATION: History of testicular cancer, now with concern for multiple myeloma. Please perform bone marrow biopsy and aspiration for tissue diagnostic purposes.  EXAM: CT GUIDED BONE MARROW BIOPSY AND ASPIRATION  MEDICATIONS: Fentanyl 150 mcg IV; Versed 4 mg IV  ANESTHESIA/SEDATION: Sedation Time  10 minutes  CONTRAST:  None  COMPLICATIONS: None immediate.  PROCEDURE: Informed consent was obtained from the patient following an explanation of the procedure, risks, benefits and alternatives. The patient understands, agrees  and consents for the procedure. All questions were addressed. A time out was performed prior to the initiation of the procedure. The patient was positioned prone and non-contrast localization CT was performed of the pelvis to demonstrate the iliac marrow spaces. The operative site was prepped and draped in the usual sterile fashion.  Under sterile conditions and local anesthesia, a 22 gauge spinal needle was utilized for procedural planning. Next, an 11 gauge coaxial bone biopsy needle was advanced into the right iliac marrow space. Needle position was confirmed with CT imaging. Initially, bone marrow aspiration was performed. Next, a bone marrow biopsy was obtained with the 11 gauge outer bone marrow device. Samples were prepared with the cytotechnologist and deemed adequate. The needle was removed intact. Hemostasis was obtained with compression and a dressing was placed. The patient tolerated the procedure well without immediate post procedural complication.  IMPRESSION: Successful CT guided right iliac bone marrow aspiration and core biopsies.   Electronically Signed   By: Sandi Mariscal M.D.   On: 10/11/2013 12:10   Dg Bone Survey Met  10/12/2013   CLINICAL DATA:  Possible myeloma.  EXAM: METASTATIC BONE SURVEY   COMPARISON:  None.  FINDINGS: Imaging of the spine, ribs, upper extremities, lower extremities, and pelvis shows no evidence of myeloma. The lateral image of the skull is notable for a lucency near the coronal suture measuring 6 mm. Ill-defined shaped favors a vascular structure rather than typical punched-out myelomatous focus. This could be followed depending on clinical suspicion for myeloma.  Shallow bony excrescence from the medial metadiaphysis of the proximal left tibia which favors sessile osteochondromata.  No evidence of active intrathoracic disease.  IMPRESSION: No findings typical of myeloma.  See discussion above.   Electronically Signed   By: Jorje Guild M.D.   On: 10/12/2013 16:46    Microbiology: Recent Results (from the past 240 hour(s))  URINE CULTURE     Status: None   Collection Time    10/06/13 12:40 AM      Result Value Ref Range Status   Specimen Description URINE, CATHETERIZED   Final   Special Requests NONE   Final   Culture  Setup Time     Final   Value: 10/06/2013 01:29     Performed at [facility]     Final   Value: NO GROWTH     Performed at [facility]   Culture     Final   Value: NO GROWTH     Performed at [facility]   Report Status 10/07/2013 FINAL   Final     Labs: Basic Metabolic Panel:  Recent Labs Lab 10/09/13 0549 10/10/13 0405 10/11/13 0616 10/13/13 0542 10/13/13 0925 10/14/13 0435  NA 136* 141 141 141 142 142  K 5.2 4.9 5.4* 5.5* 5.6* 5.4*  CL 101 105 105 108 108 110  CO2 25 23 23 22 22 22   GLUCOSE 107* 104* 106* 101* 91 96  BUN 45* 49* 55* 56* 51* 50*  CREATININE 3.96* 4.09* 4.00* 3.90* 3.63* 3.52*  CALCIUM 9.4 9.1 9.5 9.0 9.1 8.8  PHOS 4.8* 4.7* 4.8*  --   --   --    Liver Function Tests:  Recent Labs Lab 10/09/13 0549 10/10/13 0405 10/11/13 0616 10/13/13 0925 10/14/13 0435  AST  --   --   --  14 12  ALT  --   --   --  10 9  ALKPHOS  --   --   --  54 56  BILITOT  --   --    --  <0.2* <0.2*  PROT  --   --   --  6.7 6.3  ALBUMIN 3.3* 3.4* 3.4* 3.1* 3.0*   No results found for this basename: LIPASE, AMYLASE,  in the last 168 hours No results found for this basename: AMMONIA,  in the last 168 hours CBC:  Recent Labs Lab 10/09/13 0549 10/11/13 0616 10/13/13 0925 10/14/13 0435  WBC 5.5 5.8 4.9 5.4  NEUTROABS 3.0  --   --   --   HGB 8.3* 8.3* 7.6* 7.2*  HCT 24.5* 25.6* 22.9* 22.1*  MCV 95.7 96.6 95.0 95.3  PLT 213 223 220 208   Cardiac Enzymes: No results found for this basename: CKTOTAL, CKMB, CKMBINDEX, TROPONINI,  in the last 168 hours BNP: BNP (last 3 results) No results found for this basename: PROBNP,  in the last 8760 hours CBG: No results found for this basename: GLUCAP,  in the last 168 hours     Signed:  [facility] 10/14/2013, 12:16 PM

## 2013-10-14 NOTE — Progress Notes (Signed)
Adam Herring is doing well this morning. His renal function is improving. His BUN and creatinine are 51 and 3.63, and these were performed on September 3.  His bone marrow biopsy shows nonpresent plasma cells. However, there is a polyclonal increase in kappa and lambda light chains. This, is not diagnostic of myeloma. 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  His hemoglobin was 7.2. He's asymptomatic with this. Again, I suspect that he has a very low erythropoietin level. I am checking an erythropoietin I will on him. Results are not back yet.  The 24 hour urine is in progress right now.  His vital signs are all stable. Temperature is 98.6. Pulse 84. Blood pressure 127/63. His lungs are clear. Oral exam shows no mucositis. There is no thrush. Cardiac exam regular in rhythm. There are no murmurs. Abdomen is soft. Has good bowel sounds. There is no fluid wave. There is no palpable liver or spleen tip. Extremities shows no clubbing, cyanosis or edema.  I see nothing that shows him having myeloma. We'll have to suspect that the renal insufficiency is from obstruction secondary to his prostate. His a Foley catheter in. It her urine is in progress.  I think that he probably could go home. One thing that has be washed off for is his hemoglobin. Again this is on the low side. He is a symptomatic with this. I don't think that he will be able to make a lot of red cells, because of the renal insufficiency and likely, a low erythropoietin level. 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 His bone marrow is trying to make red cells. There is nothing in the bone marrow that shows myelo dysplasia. Cytogenetics are pending on the bone marrow.  We will continue following him as he is an inpatient. He really wants to go home. He will be moving out to the [place] area. I gave him the name of a local urologist, Dr. Claudia Desanctis, who, I'm sure, will be available with the urinary obstructive  issues.  [name]  Romans 8:28

## 2013-10-14 NOTE — Discharge Instructions (Signed)
Acute Urinary Retention °Acute urinary retention is the temporary inability to urinate. °This is a common problem in older men. As men age their prostates become larger and block the flow of urine from the bladder. This is usually a problem that has come on gradually.  °HOME CARE INSTRUCTIONS °If you are sent home with a Foley catheter and a drainage system, you will need to discuss the best course of action with your health care provider. While the catheter is in, maintain a good intake of fluids. Keep the drainage bag emptied and lower than your catheter. This is so that contaminated urine will not flow back into your bladder, which could lead to a urinary tract infection. °There are two main types of drainage bags. One is a large bag that usually is used at night. It has a good capacity that will allow you to sleep through the night without having to empty it. The second type is called a leg bag. It has a smaller capacity, so it needs to be emptied more frequently. However, the main advantage is that it can be attached by a leg strap and can go underneath your clothing, allowing you the freedom to move about or leave your home. °Only take over-the-counter or prescription medicines for pain, discomfort, or fever as directed by your health care provider.  °SEEK MEDICAL CARE IF: °· You develop a low-grade fever. °· You experience spasms or leakage of urine with the spasms. °SEEK IMMEDIATE MEDICAL CARE IF:  °· You develop chills or fever. °· Your catheter stops draining urine. °· Your catheter falls out. °· You start to develop increased bleeding that does not respond to rest and increased fluid intake. °MAKE SURE YOU: °· Understand these instructions. °· Will watch your condition. °· Will get help right away if you are not doing well or get worse. °Document Released: 05/05/2000 Document Revised: 02/01/2013 Document Reviewed: 07/08/2012 °ExitCare® Patient Information ©2015 [employer]. This information is not  intended to replace advice given to you by your health care provider. Make sure you discuss any questions you have with your health care provider. ° °

## 2013-10-18 ENCOUNTER — Telehealth: Payer: Self-pay | Admitting: Family Medicine

## 2013-10-18 LAB — ERYTHROPOIETIN: ERYTHROPOIETIN: 13.8 m[IU]/mL (ref 2.6–18.5)

## 2013-10-18 NOTE — Telephone Encounter (Signed)
Pt was dc'd from [place]; last Friday. Pt [facility] . They cannot see pt until 9/17. Pt states he should be seen before then.  Pt has a catheter and had to go to ed this weekend bc it got clogged up. Pt  Thought if he called here perhaps we could help w/ earlier appt. pls advise  Pt also wants to know if he should see nephrologist asap as well.

## 2013-10-19 ENCOUNTER — Telehealth: Payer: Self-pay | Admitting: *Deleted

## 2013-10-19 NOTE — Telephone Encounter (Signed)
Spoke with patient and he should call and see if there is a Stage manager.

## 2013-10-19 NOTE — Telephone Encounter (Signed)
Message copied by Domenic Schwab on Wed Oct 19, 2013 12:51 PM ------      Message from: Brien Few      Created: Wed Oct 19, 2013  8:51 AM                   ----- Message -----         From: Ladell Pier, MD         Sent: 10/15/2013   9:46 PM           To: Ludwig Lean, RN            Discharged over the weekend.            We need to call him and see if he will be following up in [place] or [place].            Has renal failure and a monoclonal protein            Need to f/u 24hr urine result            Needs f/u with [place] and renal if he is not moving to [place] ------

## 2013-10-19 NOTE — Telephone Encounter (Signed)
Left voice message for pt to call office and confirm if he is still moving to [facility] or if he will be f/u @ this office; in which case will need to schedule appt.

## 2013-10-20 ENCOUNTER — Telehealth: Payer: Self-pay | Admitting: *Deleted

## 2013-10-20 DIAGNOSIS — D472 Monoclonal gammopathy: Secondary | ICD-10-CM

## 2013-10-20 LAB — CHROMOSOME ANALYSIS, BONE MARROW

## 2013-10-20 NOTE — Telephone Encounter (Signed)
Hospital lab unable to add electropheresis to 24 hour urine. Called pt, Dr. Benay Spice recommends he see a nephrologist here before he moves. Needs labs 9/11 or 9/14 per MD. Pt requests to be scheduled for lab on Mon 9/14.

## 2013-10-20 NOTE — Telephone Encounter (Signed)
Pt plans to move as soon as possible. Wonders if Dr. Benay Spice has any contacts in the [place] area that would be good for him to see. Asks how soon does he need to be seen?  Says if he can't get in with MD in that area soon enough he would like to be seen here. Also reports he has not been able to get in with a urologist locally.

## 2013-10-21 ENCOUNTER — Other Ambulatory Visit: Payer: Self-pay | Admitting: *Deleted

## 2013-10-21 ENCOUNTER — Telehealth: Payer: Self-pay | Admitting: Oncology

## 2013-10-21 ENCOUNTER — Telehealth: Payer: Self-pay | Admitting: *Deleted

## 2013-10-21 NOTE — Telephone Encounter (Signed)
s.w pt and advised on Sept appt...pt ok adn aware °

## 2013-10-21 NOTE — Telephone Encounter (Signed)
Left message on voicemail for Dr. Abel Presto scheduler requesting they contact pt for appt. Pt states he plans to move to [facility] but has not been able to get in with a urologist yet. Labs will be drawn in this office on 9/14- will copy to nephrology.

## 2013-10-24 ENCOUNTER — Telehealth: Payer: Self-pay | Admitting: *Deleted

## 2013-10-24 ENCOUNTER — Other Ambulatory Visit (HOSPITAL_BASED_OUTPATIENT_CLINIC_OR_DEPARTMENT_OTHER): Payer: BC Managed Care – PPO

## 2013-10-24 DIAGNOSIS — D472 Monoclonal gammopathy: Secondary | ICD-10-CM

## 2013-10-24 DIAGNOSIS — N289 Disorder of kidney and ureter, unspecified: Secondary | ICD-10-CM

## 2013-10-24 LAB — BASIC METABOLIC PANEL (CC13)
ANION GAP: 10 meq/L (ref 3–11)
BUN: 46.9 mg/dL — ABNORMAL HIGH (ref 7.0–26.0)
CO2: 18 mEq/L — ABNORMAL LOW (ref 22–29)
Calcium: 9.2 mg/dL (ref 8.4–10.4)
Chloride: 112 mEq/L — ABNORMAL HIGH (ref 98–109)
Creatinine: 3.8 mg/dL (ref 0.7–1.3)
Glucose: 98 mg/dl (ref 70–140)
POTASSIUM: 5 meq/L (ref 3.5–5.1)
SODIUM: 140 meq/L (ref 136–145)

## 2013-10-24 LAB — CBC WITH DIFFERENTIAL/PLATELET
BASO%: 1 % (ref 0.0–2.0)
Basophils Absolute: 0.1 10*3/uL (ref 0.0–0.1)
EOS%: 6.4 % (ref 0.0–7.0)
Eosinophils Absolute: 0.3 10*3/uL (ref 0.0–0.5)
HCT: 25.2 % — ABNORMAL LOW (ref 38.4–49.9)
HGB: 8.2 g/dL — ABNORMAL LOW (ref 13.0–17.1)
LYMPH%: 23.9 % (ref 14.0–49.0)
MCH: 31.7 pg (ref 27.2–33.4)
MCHC: 32.6 g/dL (ref 32.0–36.0)
MCV: 97.1 fL (ref 79.3–98.0)
MONO#: 0.5 10*3/uL (ref 0.1–0.9)
MONO%: 10.4 % (ref 0.0–14.0)
NEUT%: 58.3 % (ref 39.0–75.0)
NEUTROS ABS: 3 10*3/uL (ref 1.5–6.5)
Platelets: 227 10*3/uL (ref 140–400)
RBC: 2.6 10*6/uL — AB (ref 4.20–5.82)
RDW: 13.1 % (ref 11.0–14.6)
WBC: 5.2 10*3/uL (ref 4.0–10.3)
lymph#: 1.2 10*3/uL (ref 0.9–3.3)

## 2013-10-24 NOTE — Telephone Encounter (Signed)
Per Dr. Benay Spice; faxed BMET results to Dr. Abel Presto office at [phone].

## 2013-10-25 ENCOUNTER — Encounter (HOSPITAL_COMMUNITY): Payer: Self-pay | Admitting: Emergency Medicine

## 2013-10-25 ENCOUNTER — Inpatient Hospital Stay (HOSPITAL_COMMUNITY)
Admission: EM | Admit: 2013-10-25 | Discharge: 2013-10-26 | DRG: 699 | Disposition: A | Discharge status: Home / Self Care | Payer: BC Managed Care – PPO | Attending: Internal Medicine | Admitting: Internal Medicine

## 2013-10-25 DIAGNOSIS — Z79899 Other long term (current) drug therapy: Secondary | ICD-10-CM

## 2013-10-25 DIAGNOSIS — D631 Anemia in chronic kidney disease: Secondary | ICD-10-CM | POA: Diagnosis present

## 2013-10-25 DIAGNOSIS — Z8547 Personal history of malignant neoplasm of testis: Secondary | ICD-10-CM

## 2013-10-25 DIAGNOSIS — R338 Other retention of urine: Secondary | ICD-10-CM

## 2013-10-25 DIAGNOSIS — J45909 Unspecified asthma, uncomplicated: Secondary | ICD-10-CM | POA: Diagnosis present

## 2013-10-25 DIAGNOSIS — Z87891 Personal history of nicotine dependence: Secondary | ICD-10-CM

## 2013-10-25 DIAGNOSIS — R339 Retention of urine, unspecified: Secondary | ICD-10-CM | POA: Diagnosis not present

## 2013-10-25 DIAGNOSIS — Y846 Urinary catheterization as the cause of abnormal reaction of the patient, or of later complication, without mention of misadventure at the time of the procedure: Secondary | ICD-10-CM | POA: Diagnosis present

## 2013-10-25 DIAGNOSIS — T8389XA Other specified complication of genitourinary prosthetic devices, implants and grafts, initial encounter: Principal | ICD-10-CM | POA: Diagnosis present

## 2013-10-25 DIAGNOSIS — D649 Anemia, unspecified: Secondary | ICD-10-CM

## 2013-10-25 DIAGNOSIS — N179 Acute kidney failure, unspecified: Secondary | ICD-10-CM | POA: Diagnosis present

## 2013-10-25 DIAGNOSIS — N189 Chronic kidney disease, unspecified: Secondary | ICD-10-CM | POA: Diagnosis present

## 2013-10-25 DIAGNOSIS — E875 Hyperkalemia: Secondary | ICD-10-CM | POA: Diagnosis present

## 2013-10-25 DIAGNOSIS — R9431 Abnormal electrocardiogram [ECG] [EKG]: Secondary | ICD-10-CM

## 2013-10-25 DIAGNOSIS — N401 Enlarged prostate with lower urinary tract symptoms: Secondary | ICD-10-CM | POA: Diagnosis not present

## 2013-10-25 DIAGNOSIS — N138 Other obstructive and reflux uropathy: Secondary | ICD-10-CM | POA: Diagnosis present

## 2013-10-25 DIAGNOSIS — T83091A Other mechanical complication of indwelling urethral catheter, initial encounter: Secondary | ICD-10-CM

## 2013-10-25 DIAGNOSIS — F319 Bipolar disorder, unspecified: Secondary | ICD-10-CM | POA: Diagnosis present

## 2013-10-25 DIAGNOSIS — R634 Abnormal weight loss: Secondary | ICD-10-CM

## 2013-10-25 DIAGNOSIS — F988 Other specified behavioral and emotional disorders with onset usually occurring in childhood and adolescence: Secondary | ICD-10-CM | POA: Diagnosis present

## 2013-10-25 DIAGNOSIS — N039 Chronic nephritic syndrome with unspecified morphologic changes: Secondary | ICD-10-CM

## 2013-10-25 DIAGNOSIS — F3130 Bipolar disorder, current episode depressed, mild or moderate severity, unspecified: Secondary | ICD-10-CM

## 2013-10-25 DIAGNOSIS — Z860100 Personal history of colon polyps, unspecified: Secondary | ICD-10-CM

## 2013-10-25 DIAGNOSIS — I129 Hypertensive chronic kidney disease with stage 1 through stage 4 chronic kidney disease, or unspecified chronic kidney disease: Secondary | ICD-10-CM | POA: Diagnosis present

## 2013-10-25 DIAGNOSIS — D472 Monoclonal gammopathy: Secondary | ICD-10-CM

## 2013-10-25 DIAGNOSIS — L720 Epidermal cyst: Secondary | ICD-10-CM

## 2013-10-25 HISTORY — DX: Benign prostatic hyperplasia without lower urinary tract symptoms: N40.0

## 2013-10-25 HISTORY — DX: Headache: R51

## 2013-10-25 HISTORY — DX: Malignant neoplasm of unspecified testis, unspecified whether descended or undescended: C62.90

## 2013-10-25 HISTORY — DX: Anxiety disorder, unspecified: F41.9

## 2013-10-25 LAB — CBC WITH DIFFERENTIAL/PLATELET
BASOS PCT: 1 % (ref 0–1)
Basophils Absolute: 0 10*3/uL (ref 0.0–0.1)
Eosinophils Absolute: 0.3 10*3/uL (ref 0.0–0.7)
Eosinophils Relative: 6 % — ABNORMAL HIGH (ref 0–5)
HCT: 23.9 % — ABNORMAL LOW (ref 39.0–52.0)
Hemoglobin: 7.8 g/dL — ABNORMAL LOW (ref 13.0–17.0)
Lymphocytes Relative: 28 % (ref 12–46)
Lymphs Abs: 1.5 10*3/uL (ref 0.7–4.0)
MCH: 31 pg (ref 26.0–34.0)
MCHC: 32.6 g/dL (ref 30.0–36.0)
MCV: 94.8 fL (ref 78.0–100.0)
MONOS PCT: 7 % (ref 3–12)
Monocytes Absolute: 0.4 10*3/uL (ref 0.1–1.0)
NEUTROS ABS: 3 10*3/uL (ref 1.7–7.7)
NEUTROS PCT: 58 % (ref 43–77)
Platelets: 226 10*3/uL (ref 150–400)
RBC: 2.52 MIL/uL — ABNORMAL LOW (ref 4.22–5.81)
RDW: 12.8 % (ref 11.5–15.5)
WBC: 5.2 10*3/uL (ref 4.0–10.5)

## 2013-10-25 LAB — BASIC METABOLIC PANEL
Anion gap: 12 (ref 5–15)
BUN: 50 mg/dL — AB (ref 6–23)
CHLORIDE: 109 meq/L (ref 96–112)
CO2: 20 mEq/L (ref 19–32)
Calcium: 9.1 mg/dL (ref 8.4–10.5)
Creatinine, Ser: 3.65 mg/dL — ABNORMAL HIGH (ref 0.50–1.35)
GFR calc Af Amer: 19 mL/min — ABNORMAL LOW (ref >90)
GFR calc non Af Amer: 16 mL/min — ABNORMAL LOW (ref >90)
Glucose, Bld: 100 mg/dL — ABNORMAL HIGH (ref 70–99)
POTASSIUM: 5.9 meq/L — AB (ref 3.7–5.3)
SODIUM: 141 meq/L (ref 137–147)

## 2013-10-25 LAB — URINALYSIS, ROUTINE W REFLEX MICROSCOPIC
Bilirubin Urine: NEGATIVE
Glucose, UA: NEGATIVE mg/dL
Ketones, ur: NEGATIVE mg/dL
NITRITE: NEGATIVE
Protein, ur: 100 mg/dL — AB
SPECIFIC GRAVITY, URINE: 1.012 (ref 1.005–1.030)
Urobilinogen, UA: 0.2 mg/dL (ref 0.0–1.0)
pH: 5.5 (ref 5.0–8.0)

## 2013-10-25 LAB — CBG MONITORING, ED: GLUCOSE-CAPILLARY: 108 mg/dL — AB (ref 70–99)

## 2013-10-25 LAB — URINE MICROSCOPIC-ADD ON

## 2013-10-25 MED ORDER — HYDROMORPHONE HCL PF 1 MG/ML IJ SOLN
1.0000 mg | Freq: Once | INTRAMUSCULAR | Status: AC
  Administered 2013-10-25: 1 mg via INTRAMUSCULAR
  Filled 2013-10-25: qty 1

## 2013-10-25 MED ORDER — SODIUM CHLORIDE 0.9 % IV SOLN
1.0000 g | Freq: Once | INTRAVENOUS | Status: AC
  Administered 2013-10-25: 1 g via INTRAVENOUS
  Filled 2013-10-25: qty 10

## 2013-10-25 MED ORDER — ACETAMINOPHEN 650 MG RE SUPP: 650.0000 mg | Freq: Four times a day (QID) | RECTAL | Status: DC | PRN

## 2013-10-25 MED ORDER — ACETAMINOPHEN 325 MG PO TABS
650.0000 mg | ORAL_TABLET | Freq: Four times a day (QID) | ORAL | Status: DC | PRN
Start: 2013-10-25 — End: 2013-10-26

## 2013-10-25 MED ORDER — ONDANSETRON HCL 4 MG PO TABS: 4.0000 mg | ORAL_TABLET | Freq: Four times a day (QID) | ORAL | Status: DC | PRN

## 2013-10-25 MED ORDER — ONDANSETRON HCL 4 MG/2ML IJ SOLN: 4.0000 mg | Freq: Four times a day (QID) | INTRAMUSCULAR | Status: DC | PRN

## 2013-10-25 MED ORDER — MORPHINE SULFATE 2 MG/ML IJ SOLN: 2.0000 mg | INTRAMUSCULAR | Status: DC | PRN

## 2013-10-25 MED ORDER — FINASTERIDE 5 MG PO TABS
5.0000 mg | ORAL_TABLET | Freq: Every day | ORAL | Status: DC
  Administered 2013-10-25 – 2013-10-26 (×2): 5 mg via ORAL
  Filled 2013-10-25 (×2): qty 1

## 2013-10-25 MED ORDER — ACETAMINOPHEN 500 MG PO TABS
1000.0000 mg | ORAL_TABLET | Freq: Once | ORAL | Status: AC
  Administered 2013-10-25: 1000 mg via ORAL
  Filled 2013-10-25: qty 2

## 2013-10-25 MED ORDER — DEXTROSE 50 % IV SOLN: 1.0000 | Freq: Once | INTRAVENOUS | Status: DC

## 2013-10-25 MED ORDER — TAMSULOSIN HCL 0.4 MG PO CAPS
0.4000 mg | ORAL_CAPSULE | Freq: Every day | ORAL | Status: DC
  Administered 2013-10-25 – 2013-10-26 (×2): 0.4 mg via ORAL
  Filled 2013-10-25 (×2): qty 1

## 2013-10-25 MED ORDER — ALBUTEROL SULFATE (2.5 MG/3ML) 0.083% IN NEBU: 2.0000 mL | INHALATION_SOLUTION | Freq: Four times a day (QID) | RESPIRATORY_TRACT | Status: DC | PRN

## 2013-10-25 MED ORDER — LAMOTRIGINE 200 MG PO TABS
200.0000 mg | ORAL_TABLET | Freq: Every day | ORAL | Status: DC
  Administered 2013-10-25 – 2013-10-26 (×2): 200 mg via ORAL
  Filled 2013-10-25 (×2): qty 1

## 2013-10-25 MED ORDER — PNEUMOCOCCAL VAC POLYVALENT 25 MCG/0.5ML IJ INJ
0.5000 mL | INJECTION | INTRAMUSCULAR | Status: DC
  Filled 2013-10-25: qty 0.5

## 2013-10-25 MED ORDER — INSULIN ASPART 100 UNIT/ML IV SOLN
5.0000 [IU] | Freq: Once | INTRAVENOUS | Status: AC
  Administered 2013-10-25: 5 [IU] via INTRAVENOUS
  Filled 2013-10-25: qty 0.05

## 2013-10-25 MED ORDER — OXYCODONE HCL 5 MG PO TABS: 5.0000 mg | ORAL_TABLET | ORAL | Status: DC | PRN

## 2013-10-25 MED ORDER — SODIUM CHLORIDE 0.9 % IV SOLN
INTRAVENOUS | Status: DC
  Administered 2013-10-25 – 2013-10-26 (×2): via INTRAVENOUS

## 2013-10-25 MED ORDER — SODIUM CHLORIDE 0.9 % IV BOLUS (SEPSIS)
1000.0000 mL | Freq: Once | INTRAVENOUS | Status: AC
  Administered 2013-10-25: 1000 mL via INTRAVENOUS

## 2013-10-25 MED ORDER — SODIUM CHLORIDE 0.9 % IJ SOLN
3.0000 mL | Freq: Two times a day (BID) | INTRAMUSCULAR | Status: DC
  Administered 2013-10-26: 3 mL via INTRAVENOUS

## 2013-10-25 MED ORDER — HEPARIN SODIUM (PORCINE) 5000 UNIT/ML IJ SOLN
5000.0000 [IU] | Freq: Three times a day (TID) | INTRAMUSCULAR | Status: DC
  Administered 2013-10-25 – 2013-10-26 (×4): 5000 [IU] via SUBCUTANEOUS
  Filled 2013-10-25 (×5): qty 1

## 2013-10-25 NOTE — Progress Notes (Signed)
Attempted to call and get report. No answer. Will try again in 5 minutes.

## 2013-10-25 NOTE — ED Notes (Signed)
Dr. Regenia Skeeter at the bedside to attempt IV via u/s. Pt does not want the MD to stick him. He is requesting IV team after orally hydrating. IV team paged at this time.

## 2013-10-25 NOTE — ED Notes (Signed)
Pt c/o blood on the urine and clots on the foley catheter that he had placed in las week on the ER. Pt having 4/10 pain. Pt denies fever, nausea or vomiting.

## 2013-10-25 NOTE — ED Notes (Signed)
IV stick x 2 unsuccessful. IV team paged.

## 2013-10-25 NOTE — ED Provider Notes (Signed)
CSN: [id number]     Arrival date & time 10/25/13  6237 History   First MD Initiated Contact with Patient 10/25/13 [phone]     Chief Complaint  Patient presents with  . Hematuria     (Consider location/radiation/quality/duration/timing/severity/associated sxs/prior Treatment) HPI 63 year old male presents with blood clots in his Foley catheter. At the end of last month he had a Foley catheter placed (approx 2 weeks ago)  for acute renal failure. About one week ago he had to go to [facility] because he had blood clots in his Foley catheter. These were completely obstructing his Foley and had to have a new Foley placed. This morning after having a bowel movement he noticed that this is having blood in his Foley catheter. Is not had any urine output since. He states he's having a little bit of perirectal pain and is concerned he has repeat hemorrhoids. Denies any blood in his stools. He states he's been having a metallic taste in his mouth on and off for a while and is concerned that he has repeat renal failure.   Past Medical History  Diagnosis Date  . ADD (attention deficit disorder)   . Allergy   . Asthma   . Cancer     testicular  . Depression   . Bipolar 1 disorder    Past Surgical History  Procedure Laterality Date  . Colon surgery     Family History  Problem Relation Age of Onset  . Asthma Other   . Hypertension Other   . Cancer Other    History  Substance Use Topics  . Smoking status: Former Research scientist (life sciences)  . Smokeless tobacco: Former Systems developer  . Alcohol Use: Yes     Comment: weekly (was a couple of beers daily)     Review of Systems  Constitutional: Negative for fever.  Gastrointestinal: Positive for rectal pain. Negative for nausea, vomiting and abdominal pain.  Genitourinary: Positive for hematuria, decreased urine volume and difficulty urinating. Negative for dysuria.  Musculoskeletal: Negative for back pain.  All other systems reviewed and are negative.     Allergies  Dust  mite extract; Pollen extract; Bean pod extract; Other; Sesame oil; and Tomato  Home Medications   Prior to Admission medications   Medication Sig Start Date End Date Taking? Authorizing Provider  albuterol (PROVENTIL HFA;VENTOLIN HFA) 108 (90 BASE) MCG/ACT inhaler Inhale 2 puffs into the lungs every 6 (six) hours as needed.    Historical Provider, MD  amphetamine-dextroamphetamine (ADDERALL) 30 MG tablet Take 15-30 mg by mouth 2 (two) times daily as needed (for aniexty).    Historical Provider, MD  b complex vitamins tablet Take 1 tablet by mouth daily.    Historical Provider, MD  ferrous sulfate 325 (65 FE) MG tablet Take 325 mg by mouth daily with breakfast.    Historical Provider, MD  lamoTRIgine (LAMICTAL) 200 MG tablet Take 200 mg by mouth daily.    Historical Provider, MD  LITHIUM PO Take 1 capsule by mouth 3 (three) times daily as needed (for aniexty). Has samples from Dr    Historical Provider, MD  tamsulosin (FLOMAX) 0.4 MG CAPS capsule Take 1 capsule (0.4 mg total) by mouth daily. 10/14/13   Annita Brod, MD   BP 125/69  Pulse 84  Temp(Src) 97.6 F (36.4 C) (Oral)  Resp 16  Ht 6\' 3"  (1.905 m)  Wt 177 lb (80.287 kg)  BMI 22.12 kg/m2  SpO2 100% Physical Exam  Nursing note and vitals reviewed. Constitutional: He  is oriented to person, place, and time. He appears well-developed and well-nourished.  HENT:  Head: Normocephalic and atraumatic.  Right Ear: External ear normal.  Left Ear: External ear normal.  Nose: Nose normal.  Eyes: Right eye exhibits no discharge. Left eye exhibits no discharge.  Neck: Neck supple.  Cardiovascular: Normal rate, regular rhythm, normal heart sounds and intact distal pulses.   Pulmonary/Chest: Effort normal.  Abdominal: Soft. There is tenderness (mild) in the suprapubic area. There is no CVA tenderness.  Genitourinary:  Foley catheter in place with large amount of clot at proximal aspect. Minimal fluid in leg bag No obvious hemorrhoids on  exam, patient declines rectal exam internally.  Musculoskeletal: He exhibits no edema.  Neurological: He is alert and oriented to person, place, and time.  Skin: Skin is warm and dry.    ED Course  Procedures (including critical care time) Labs Review Labs Reviewed  CBC WITH DIFFERENTIAL - Abnormal; Notable for the following:    RBC 2.52 (*)    Hemoglobin 7.8 (*)    HCT 23.9 (*)    Eosinophils Relative 6 (*)    All other components within normal limits  BASIC METABOLIC PANEL - Abnormal; Notable for the following:    Potassium 5.9 (*)    Glucose, Bld 100 (*)    BUN 50 (*)    Creatinine, Ser 3.65 (*)    GFR calc non Af Amer 16 (*)    GFR calc Af Amer 19 (*)    All other components within normal limits  URINALYSIS, ROUTINE W REFLEX MICROSCOPIC - Abnormal; Notable for the following:    Color, Urine RED (*)    APPearance CLOUDY (*)    Hgb urine dipstick LARGE (*)    Protein, ur 100 (*)    Leukocytes, UA SMALL (*)    All other components within normal limits  CBG MONITORING, ED - Abnormal; Notable for the following:    Glucose-Capillary 108 (*)    All other components within normal limits  URINE CULTURE  URINE MICROSCOPIC-ADD ON    Imaging Review No results found.   EKG Interpretation None       Date: 10/25/2013  Rate: 72  Rhythm: normal sinus rhythm  QRS Axis: normal  Intervals: normal  ST/T Wave abnormalities: Diffuse peaked T waves  Conduction Disutrbances:none  Narrative Interpretation: Peaked T waves new compared to 2013  Old EKG Reviewed: changes noted   MDM   Final diagnoses:  Obstruction of Foley catheter, initial encounter  Hyperkalemia    Patient eventually had to have his Foley replaced at irrigating foot did not remove the clot. He does have improving renal function but has a potassium of 5.9. EKG was obtained and shows peaked T waves. Patient is otherwise asymptomatic, but due to this will need calcium, insulin/glucose, and admission to the  hospitalist. His IV was delayed as he is a very difficult stick. A tried with ultrasound that he made me stop after his veins were hard to cannulate. Patient wanted to take a break and rehydrate with oral fluids. I discussed that this could lead to a delay in his treatment of hyperkalemia and cause worsening hyperkalemia that could cause cardiac issues or arrhythmias or even life-threatening arrhythmias. Patient seemed to understand what the weight. Eventually IV team was able to retry and get an IV. He was then given his medicines and will be admitted to the hospitalist.    Ephraim Hamburger, MD 10/25/13 [phone]

## 2013-10-25 NOTE — Progress Notes (Signed)
Adam Herring [id number] Code Status:Full   Admission Data: 10/25/2013 4:52 PM Attending Provider:  Zamora, E VQX:IHWT,UUEKCMK ALLEN, MD Consults/ Treatment Team: Treatment Team:  Alexis Frock, MD  [name] is a 63 y.o. male patient admitted from ED awake, alert - oriented  X 3 - no acute distress noted.  VSS - Blood pressure 152/75, pulse 70, temperature 97.7 F (36.5 C), temperature source Oral, resp. rate 16, height 6\' 3"  (1.905 m), weight 80.28 kg (176 lb 15.8 oz), SpO2 100.00%.    IV in place, occlusive dsg intact without redness.  Orientation to room, and floor completed with information packet given to patient/family.  Patient declined safety video at this time.  Admission INP armband ID verified with patient/family, and in place.   SR up x 2, fall assessment complete, with patient and family able to verbalize understanding of risk associated with falls, and verbalized understanding to call nsg before up out of bed.  Call light within reach, patient able to voice, and demonstrate understanding.  Skin, clean-dry- intact without evidence of bruising, or skin tears.   No evidence of skin break down noted on exam.     Will cont to eval and treat per MD orders.  Adam Hole L, RN 10/25/2013 4:52 PM

## 2013-10-25 NOTE — Consult Note (Signed)
Reason for Consult:Urinary Retention, Renal failure, Hematuria, Testis Cancer  Referring Physician: Kelvin Cellar MD   Adam Herring is an 63 y.o. male.   HPI:   1 -  Urinary Retention - new urinary retention 10/2013 prompting catheter palcement last admission. CT with 130gm prostate. Admits to some baseline LUTS, but no prior retention. Has been catheter dependant since last admission and had GU appt scheduled for 9/17 for re-evaluation. PSA 2014 <4.  Noted some low UOP and single clot in catheter tubing promptingER eval where foley replaced and now working w/o issue.  2 - Renal failure - new renal insuficiency 10/2013 during admission for retention. Cr peak 6.5 with some hyperkalemia, now down to 3.5 range. Was <1.5 last year. Mild bilat hydro to bladder by CT 10/2013.  3 - Hematuria - new hematuria and small clot AFTER catheterization. Non-smoker. No textile / petrochemical exposures. CT w/o worriseom masses.    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 CT this admission w/o adenopathy.   Today Adam Herring is is seen in reconsultation for above now admitted with some hyperkalemia. 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 He is trying to relocate with his partner to [facility].   Past Medical History  Diagnosis Date  . ADD (attention deficit disorder)   . Allergy   . Asthma   . Cancer     testicular  . Depression   . Bipolar 1 disorder     Past Surgical History  Procedure Laterality Date  . Colon surgery      Family History  Problem Relation Age of Onset  . Asthma Other   . Hypertension Other   . Cancer Other     Social History:  reports that he has quit smoking. He has quit using smokeless tobacco. He reports that he drinks alcohol. He reports that he uses illicit drugs (Marijuana).  Allergies:  Allergies  Allergen Reactions  . Dust Mite Extract Shortness Of Breath  . Pollen Extract Shortness Of Breath  . Bean Pod  Extract Other (See Comments)    Eczema   . Other Other (See Comments)    Potato's - eczema   . Sesame Oil Other (See Comments)    Severe sweating   . Tomato Other (See Comments)    Eczema     Medications: I have reviewed the patient's current medications.  Results for orders placed during the hospital encounter of 10/25/13 (from the past 48 hour(s))  CBC WITH DIFFERENTIAL     Status: Abnormal   Collection Time    10/25/13  7:56 AM      Result Value Ref Range   WBC 5.2  4.0 - 10.5 K/uL   RBC 2.52 (*) 4.22 - 5.81 MIL/uL   Hemoglobin 7.8 (*) 13.0 - 17.0 g/dL   HCT 23.9 (*) 39.0 - 52.0 %   MCV 94.8  78.0 - 100.0 fL   MCH 31.0  26.0 - 34.0 pg   MCHC 32.6  30.0 - 36.0 g/dL   RDW 12.8  11.5 - 15.5 %   Platelets 226  150 - 400 K/uL   Neutrophils Relative % 58  43 - 77 %   Neutro Abs 3.0  1.7 - 7.7 K/uL   Lymphocytes Relative 28  12 - 46 %   Lymphs Abs 1.5  0.7 - 4.0 K/uL   Monocytes Relative 7  3 - 12 %   Monocytes Absolute 0.4  0.1 - 1.0 K/uL   Eosinophils Relative 6 (*) 0 - 5 %   Eosinophils Absolute 0.3  0.0 - 0.7 K/uL   Basophils Relative 1  0 - 1 %   Basophils Absolute 0.0  0.0 - 0.1 K/uL  BASIC METABOLIC PANEL     Status: Abnormal   Collection Time    10/25/13  7:56 AM      Result Value Ref Range   Sodium 141  137 - 147 mEq/L   Potassium 5.9 (*) 3.7 - 5.3 mEq/L   Chloride 109  96 - 112 mEq/L   CO2 20  19 - 32 mEq/L   Glucose, Bld 100 (*) 70 - 99 mg/dL   BUN 50 (*) 6 - 23 mg/dL   Creatinine, Ser 3.65 (*) 0.50 - 1.35 mg/dL   Calcium 9.1  8.4 - 10.5 mg/dL   GFR calc non Af Amer 16 (*) >90 mL/min   GFR calc Af Amer 19 (*) >90 mL/min   Comment: (NOTE)     The eGFR has been calculated using the CKD EPI equation.     This calculation has not been validated in all clinical situations.     eGFR's persistently <90 mL/min signify possible Chronic Kidney     Disease.   Anion gap 12  5 - 15  URINALYSIS, ROUTINE W REFLEX MICROSCOPIC     Status: Abnormal   Collection Time     10/25/13  9:47 AM      Result Value Ref Range   Color, Urine RED (*) YELLOW   Comment: BIOCHEMICALS MAY BE AFFECTED BY COLOR   APPearance CLOUDY (*) CLEAR   Specific Gravity, Urine 1.012  1.005 - 1.030   pH 5.5  5.0 - 8.0   Glucose, UA NEGATIVE  NEGATIVE mg/dL   Hgb urine dipstick LARGE (*) NEGATIVE   Bilirubin Urine NEGATIVE  NEGATIVE   Ketones, ur NEGATIVE  NEGATIVE mg/dL   Protein, ur 100 (*) NEGATIVE mg/dL   Urobilinogen, UA 0.2  0.0 - 1.0 mg/dL   Nitrite NEGATIVE  NEGATIVE   Leukocytes, UA SMALL (*) NEGATIVE  URINE MICROSCOPIC-ADD ON     Status: None   Collection Time    10/25/13  9:47 AM      Result Value Ref Range   WBC, UA 3-6  <3 WBC/hpf   RBC / HPF TOO NUMEROUS TO COUNT  <3 RBC/hpf   Bacteria, UA RARE  RARE  CBG MONITORING, ED     Status: Abnormal   Collection Time    10/25/13  2:29 PM      Result Value Ref Range   Glucose-Capillary 108 (*) 70 - 99 mg/dL    No results found.  Review of Systems  Constitutional: Negative for fever and chills.  HENT: Negative.   Eyes: Negative.   Respiratory: Negative.   Cardiovascular: Negative.   Gastrointestinal: Negative.  Negative for nausea and vomiting.  Genitourinary: Positive for hematuria. Negative for flank pain.  Musculoskeletal: Negative.   Skin: Negative.   Neurological: Negative.   Endo/Heme/Allergies: Negative.   Psychiatric/Behavioral: Negative.    Blood pressure 152/75, pulse 70, temperature 97.7 F (36.5 C), temperature source Oral, resp. rate 16, height 6' 3" (1.905 m), weight 80.28 kg (176 lb 15.8 oz), SpO2 100.00%. Physical Exam  Constitutional: He is oriented to person, place, and time. He appears well-developed and well-nourished.  HENT:  Head: Normocephalic and atraumatic.  Eyes: Pupils are equal, round, and reactive to light.  Neck: Normal range of   motion.  Cardiovascular: Normal rate and regular rhythm.   Respiratory: Effort normal.  GI: Soft. Bowel sounds are normal.  Genitourinary: Penis  normal.  Foley c/d/i with light pink urine.   Musculoskeletal: Normal range of motion.  Neurological: He is alert and oriented to person, place, and time.  Skin: Skin is warm and dry.  Psychiatric: He has a normal mood and affect. His behavior is normal. Judgment and thought content normal.    Assessment/Plan:  1 -  Urinary Retention - likely due to impressive BPH. Rec start finasteride + tamsulosin and continue at discharge. Would continue for at least 2-3 weeks before repeat trial of void. If fails x several very well may need TURP v. Simple prostatectomy.   2 - Renal failure - new this year and likely from lower tract obstruction as per above. I am concerned that some element may be permanent as Cr nadir only 3.5 at present.   3 - Hematuria - likely due to BPH. Ideally would get cysto as outpatient to help r/o lower tract lesions.    4 - Testis Cancer - no recurrence now 12 years s/p initial management.   Call with questions anytime. If pt DC tomorrow, can keep f/u appt with [place] 9/17. If remains in house for few days will need reschedule v. Establish with urologist near [facility] such as group from [facility] or [facility] as that is his ultimate goal.    Tresa Moore, Natavia Sublette 10/25/2013, 5:12 PM

## 2013-10-25 NOTE — ED Notes (Signed)
Inserted 77F foley with no resistance, unable to obtain urine

## 2013-10-25 NOTE — ED Notes (Signed)
Attempted to irrigate foley, unable to irrigate. MD notified

## 2013-10-25 NOTE — ED Notes (Signed)
Admitting PA at bedside.

## 2013-10-25 NOTE — H&P (Signed)
[facility] History and Physical  Adam Herring Behanna SWF:[id number] DOB: 1950/12/03 DOA: 10/25/2013  Referring physician:  PCP: Joycelyn Man, MD   Chief Complaint: Urinary retention, difficulty urinating  HPI: Adam Herring is a 63 y.o. male  63 year old male with PMH of Acute renal failure (admitted 10/04/13 discharged 10/14/13 with foley catheter), ADD and Bipolar disorder presented to the ED with urinary retention and difficulty urinating since last night. On recent admission, he had bilateral hydronephrosis and large prostate and was discharged with foley catheter, with urology follow up one week later. A week after discharge, patient had similar complaints of urinary rentention and dysuria, presented to [facility] center and was found to have blood clot in catheter that was obstructing flow, and thus catheter was removed and replaced. Current episode started after having a bowel movement last night, he noticed a blood clot in the Foley catheter, and has not had any urine output since. Denies any blood in his stools. He states he's having perirectal pain and is concerned about recurrent hemorrhoids.  He complains of metallic taste in his mouth, which he states is consistent with when he had ARF. In Ed, Foley catheter could be irrigated and was thus replaced. He is found to have potassium of 5.9 and EKG changes of peaked T waves, which is new from prior EKG done 10/05/13. Patient is admitted  Review of Systems:  Constitutional: No weight loss, night sweats, Fevers, chills, fatigue.  HEENT: No headaches, Difficulty swallowing,Tooth/dental problems,Sore throat, No sneezing, itching, ear ache, nasal congestion, post nasal drip,  Cardio-vascular: No chest pain, Orthopnea, PND, swelling in lower extremities, anasarca, dizziness, palpitations  GI: Positive for rectal pain. No heartburn, indigestion, abdominal pain, nausea, vomiting, diarrhea, change in bowel habits, loss of appetite.    Resp: No shortness of breath with exertion or at rest. No excess mucus, no productive cough, No non-productive cough, No coughing up of blood.No change in color of mucus.No wheezing.No chest wall deformity  Skin: no rash or lesions.  GU: positive for hemturia, decreased urine flow, and difficulty urinating. no dysuria, change in color of urine, no urgency or frequency. No flank pain.  Musculoskeletal: No joint pain or swelling. No decreased range of motion. No back pain.  Psych: No change in mood or affect. No depression or anxiety. No memory loss.   Past Medical History  Diagnosis Date  . ADD (attention deficit disorder)   . Allergy   . Asthma   . Cancer     testicular  . Depression   . Bipolar 1 disorder    Past Surgical History  Procedure Laterality Date  . Colon surgery     Social History:  reports that he has quit smoking. He has quit using smokeless tobacco. He reports that he drinks alcohol. He reports that he uses illicit drugs (Marijuana).  Allergies  Allergen Reactions  . Dust Mite Extract Shortness Of Breath  . Pollen Extract Shortness Of Breath  . Bean Pod Extract Other (See Comments)    Eczema   . Other Other (See Comments)    Potato's - eczema   . Sesame Oil Other (See Comments)    Severe sweating   . Tomato Other (See Comments)    Eczema     Family History  Problem Relation Age of Onset  . Asthma Other   . Hypertension Other   . Cancer Other      Prior to Admission medications   Medication Sig Start Date End Date  Taking? Authorizing Provider  albuterol (PROVENTIL HFA;VENTOLIN HFA) 108 (90 BASE) MCG/ACT inhaler Inhale 2 puffs into the lungs every 6 (six) hours as needed for wheezing or shortness of breath.    Yes Historical Provider, MD  amphetamine-dextroamphetamine (ADDERALL) 30 MG tablet Take 15-30 mg by mouth 2 (two) times daily as needed (for aniexty).   Yes Historical Provider, MD  ferrous sulfate 325 (65 FE) MG tablet Take 325 mg by mouth daily  with breakfast.   Yes Historical Provider, MD  lamoTRIgine (LAMICTAL) 200 MG tablet Take 200 mg by mouth daily.   Yes Historical Provider, MD  tamsulosin (FLOMAX) 0.4 MG CAPS capsule Take 1 capsule (0.4 mg total) by mouth daily. 10/14/13  Yes Annita Brod, MD   Physical Exam: Filed Vitals:   10/25/13 0656 10/25/13 0900 10/25/13 1211  BP: 125/69 121/72 123/68  Pulse: 84    Temp: 97.6 F (36.4 C) 97.9 F (36.6 C)   TempSrc: Oral Oral   Resp: 16 12 13   Height: 6\' 3"  (1.905 m)    Weight: 80.287 kg (177 lb)    SpO2: 100% 100% 99%    Wt Readings from Last 3 Encounters:  10/25/13 80.287 kg (177 lb)  10/13/13 80.332 kg (177 lb 1.6 oz)  09/29/13 79.833 kg (176 lb)    General:  Alert and oriented [name] in NAD.  Eyes: PERRL, normal lids, irises & conjunctiva ENT: grossly normal hearing, lips & tongue Neck: no LAD, masses or thyromegaly Cardiovascular: RRR, no m/r/g. No LE edema. Telemetry: SR, no arrhythmias  Respiratory: CTA bilaterally, no w/r/r. Normal respiratory effort. Abdomen: soft, ntnd. Mild tenderness in suprapubic area. Foley catheter with minimal fluid in bag Skin: no rash or induration seen on limited exam Musculoskeletal: grossly normal tone BUE/BLE Psychiatric: grossly normal mood and affect, speech fluent and appropriate Neurologic: grossly non-focal.          Labs on Admission:  Basic Metabolic Panel:  Recent Labs Lab 10/24/13 1207 10/25/13 0756  NA 140 141  K 5.0 5.9*  CL  --  109  CO2 18* 20  GLUCOSE 98 100*  BUN 46.9* 50*  CREATININE 3.8 Repeated and Verified* 3.65*  CALCIUM 9.2 9.1   Liver Function Tests: No results found for this basename: AST, ALT, ALKPHOS, BILITOT, PROT, ALBUMIN,  in the last 168 hours No results found for this basename: LIPASE, AMYLASE,  in the last 168 hours No results found for this basename: AMMONIA,  in the last 168 hours CBC:  Recent Labs Lab 10/24/13 1208 10/25/13 0756  WBC 5.2 5.2  NEUTROABS  3.0 3.0  HGB 8.2* 7.8*  HCT 25.2* 23.9*  MCV 97.1 94.8  PLT 227 226   Cardiac Enzymes: No results found for this basename: CKTOTAL, CKMB, CKMBINDEX, TROPONINI,  in the last 168 hours  BNP (last 3 results) No results found for this basename: PROBNP,  in the last 8760 hours CBG: No results found for this basename: GLUCAP,  in the last 168 hours  Radiological Exams on Admission: No results found.  EKG: Independently reviewed. Peaked T waves  Assessment/Plan Principal Problem:   Acute urinary retention Active Problems:   Anemia, unspecified   Acute renal failure   Abnormal EKG   Hyperkalemia   Acute urinary retention -catheter could not be irrigated; new catheter placed -urology consulted by ED physician -PRN pain management   Abnormal EKG -Secondary to hyperkalemia -EKG with peaked T waves -placed on telemetry -Repeat EKG   ARF -creatinine at  likely due to obstruction of urinary flow from blood clot -UA- without evidence of infection -continue to monitor BMET  Hyperkalemia -Given IV Calcium gluconate,  Insulin and fluid bolus -recheck BMET  Normocytic Anemia -likely secondray to renal disease - hgb stable at 7.8 -continue to monitor CBC  Bipolar Disorder Continue home regimen of Lamictal   Consult place to urologist on call.   Code Status: Full DVT Prophylaxis: Heparin [place] Family Communication: No family at bedside Disposition Plan: Inpatient  Time spent: [place], [place] [facility] Pager [phone]  **Disclaimer: This note may have been dictated with voice recognition software. Similar sounding words can inadvertently be transcribed and this note may contain transcription errors which may not have been corrected upon publication of note.**   Addendum I personally saw and evaluated patient, having recent hospitalization discharged with a foley catheter, patient noting diminished urinary output in the past 24 hours. Labs were  reviewed has developed acute on chronic renal failure. Catheter was replaced in the ER. Urology was consulted. Will admit patient to medicine, start gentle IV fluid hydration, await further recommendation from Urology, repeat labs in AM.

## 2013-10-26 DIAGNOSIS — E875 Hyperkalemia: Secondary | ICD-10-CM

## 2013-10-26 DIAGNOSIS — N179 Acute kidney failure, unspecified: Secondary | ICD-10-CM

## 2013-10-26 DIAGNOSIS — T8389XA Other specified complication of genitourinary prosthetic devices, implants and grafts, initial encounter: Principal | ICD-10-CM

## 2013-10-26 LAB — CBC
HEMATOCRIT: 24 % — AB (ref 39.0–52.0)
Hemoglobin: 7.9 g/dL — ABNORMAL LOW (ref 13.0–17.0)
MCH: 31.2 pg (ref 26.0–34.0)
MCHC: 32.9 g/dL (ref 30.0–36.0)
MCV: 94.9 fL (ref 78.0–100.0)
PLATELETS: 244 10*3/uL (ref 150–400)
RBC: 2.53 MIL/uL — AB (ref 4.22–5.81)
RDW: 12.9 % (ref 11.5–15.5)
WBC: 4.8 10*3/uL (ref 4.0–10.5)

## 2013-10-26 LAB — BASIC METABOLIC PANEL
ANION GAP: 10 (ref 5–15)
Anion gap: 9 (ref 5–15)
BUN: 38 mg/dL — ABNORMAL HIGH (ref 6–23)
BUN: 32 mg/dL — ABNORMAL HIGH (ref 6–23)
CALCIUM: 9.1 mg/dL (ref 8.4–10.5)
CO2: 20 meq/L (ref 19–32)
CO2: 21 mEq/L (ref 19–32)
CREATININE: 2.75 mg/dL — AB (ref 0.50–1.35)
Calcium: 9 mg/dL (ref 8.4–10.5)
Chloride: 110 mEq/L (ref 96–112)
Chloride: 110 mEq/L (ref 96–112)
Creatinine, Ser: 3.01 mg/dL — ABNORMAL HIGH (ref 0.50–1.35)
GFR calc Af Amer: 24 mL/min — ABNORMAL LOW (ref >90)
GFR calc non Af Amer: 21 mL/min — ABNORMAL LOW (ref >90)
GFR calc non Af Amer: 23 mL/min — ABNORMAL LOW (ref >90)
GFR, EST AFRICAN AMERICAN: 27 mL/min — AB (ref >90)
GLUCOSE: 97 mg/dL (ref 70–99)
Glucose, Bld: 92 mg/dL (ref 70–99)
POTASSIUM: 5.5 meq/L — AB (ref 3.7–5.3)
POTASSIUM: 5.3 meq/L (ref 3.7–5.3)
Sodium: 139 mEq/L (ref 137–147)
Sodium: 141 mEq/L (ref 137–147)

## 2013-10-26 LAB — URINE CULTURE
COLONY COUNT: NO GROWTH
Culture: NO GROWTH

## 2013-10-26 MED ORDER — FINASTERIDE 5 MG PO TABS: 5.0000 mg | ORAL_TABLET | Freq: Every day | ORAL | Status: AC

## 2013-10-26 MED ORDER — SODIUM POLYSTYRENE SULFONATE 15 GM/60ML PO SUSP
30.0000 g | Freq: Once | ORAL | Status: AC
  Administered 2013-10-26: 30 g via ORAL
  Filled 2013-10-26: qty 120

## 2013-10-26 MED ORDER — ENSURE COMPLETE PO LIQD
237.0000 mL | Freq: Two times a day (BID) | ORAL | Status: DC
  Administered 2013-10-26: 237 mL via ORAL

## 2013-10-26 MED ORDER — SODIUM POLYSTYRENE SULFONATE 15 GM/60ML PO SUSP: 15.0000 g | Freq: Once | ORAL | Status: DC

## 2013-10-26 NOTE — Progress Notes (Signed)
Gave pt 24 urine collection jug so he start collecting for urology appointment tomorrow.

## 2013-10-26 NOTE — Progress Notes (Signed)
Adam Herring to be D/C'd Home per MD order.  Discussed with the patient and all questions fully answered.    Medication List         albuterol 108 (90 BASE) MCG/ACT inhaler  Commonly known as:  PROVENTIL HFA;VENTOLIN HFA  Inhale 2 puffs into the lungs every 6 (six) hours as needed for wheezing or shortness of breath.     amphetamine-dextroamphetamine 30 MG tablet  Commonly known as:  ADDERALL  Take 15-30 mg by mouth 2 (two) times daily as needed (for aniexty).     ferrous sulfate 325 (65 FE) MG tablet  Take 325 mg by mouth daily with breakfast.     finasteride 5 MG tablet  Commonly known as:  PROSCAR  Take 1 tablet (5 mg total) by mouth daily.     lamoTRIgine 200 MG tablet  Commonly known as:  LAMICTAL  Take 200 mg by mouth daily.     tamsulosin 0.4 MG Caps capsule  Commonly known as:  FLOMAX  Take 1 capsule (0.4 mg total) by mouth daily.        VVS, Skin clean, dry and intact without evidence of skin break down, no evidence of skin tears noted. IV catheter discontinued intact. Patient is to be discharged with Foley catheter. Site without signs and symptoms of complications. Dressing and pressure applied.  An After Visit Summary was printed and given to the patient.  D/c education completed with patient/family including follow up instructions, medication list, d/c activities limitations if indicated, with other d/c instructions as indicated by MD - patient able to verbalize understanding, all questions fully answered.   Patient instructed to return to ED, call 911, or call MD for any changes in condition.   Patient escorted by RN and walked down to entrance, and D/C home via private auto.  Ting Cage L 10/26/2013 2:56 PM

## 2013-10-26 NOTE — Progress Notes (Signed)
INITIAL NUTRITION ASSESSMENT  DOCUMENTATION CODES Per approved criteria  -Not Applicable   INTERVENTION: Provide Ensure Complete po BID, each supplement provides 350 kcal and 13 grams of protein  NUTRITION DIAGNOSIS: Increased nutrient needs related to renal failure as evidenced by estimated nutrition needs.   Goal: Pt to meet >/= 90% of their estimated nutrition needs   Monitor:  PO intake, weight trends, labs, I/O's  Reason for Assessment: MST  63 y.o. male  Admitting Dx: Acute urinary retention  ASSESSMENT: Pt with with PMH of Acute renal failure, ADD and Bipolar disorder presented with urinary retention and difficulty urinating since last night. On recent admission (9/4), he had bilateral hydronephrosis and large prostate and was discharged with foley catheter.  Pt reports having a good appetite. Meal completion is 100%. Pt also reports having a good appetite at home. Pt does reports he has had some weight loss with his usual body weight of 180-185 lbs, which he reported last weighing 2-3 months ago from having a decreased appetite. Weight loss is not found significant. Pt reports his appetite has improved since then and currently has no problems. Pt does reports he would like to gain his weight back. Pt would like to try Ensure. Will order.   Pt with moderate clavicle muscle depletion, however no other significant fat or muscle mass loss.  Labs: Low GFR. High BUN, creatinine, and potassium (5.5).  Height: Ht Readings from Last 1 Encounters:  10/25/13 6\' 3"  (1.905 m)    Weight: Wt Readings from Last 1 Encounters:  10/25/13 176 lb 15.8 oz (80.28 kg)    Ideal Body Weight: 196 lbs  % Ideal Body Weight: 90%  Wt Readings from Last 10 Encounters:  10/25/13 176 lb 15.8 oz (80.28 kg)  10/13/13 177 lb 1.6 oz (80.332 kg)  09/29/13 176 lb (79.833 kg)  09/14/13 179 lb (81.194 kg)  04/14/11 184 lb (83.462 kg)  04/02/11 185 lb (83.915 kg)  02/01/09 191 lb (86.637 kg)   09/28/08 200 lb (90.719 kg)  07/26/08 193 lb (87.544 kg)  05/18/08 193 lb (87.544 kg)    Usual Body Weight: 180-185 lbs  % Usual Body Weight: 95-98%  BMI:  Body mass index is 22.12 kg/(m^2).  Estimated Nutritional Needs: Kcal: 2000-2200 Protein: 95-105 Fluid: 2 - 2.2 L/day  Skin: no issues noted  Diet Order: Cardiac  EDUCATION NEEDS: -No education needs identified at this time   Intake/Output Summary (Last 24 hours) at 10/26/13 0938 Last data filed at 10/26/13 0805  Gross per 24 hour  Intake 1016.25 ml  Output   3250 ml  Net -2233.75 ml    Last BM: 9/15  Labs:   Recent Labs Lab 10/24/13 1207 10/25/13 0756 10/26/13 0512  NA 140 141 139  K 5.0 5.9* 5.5*  CL  --  109 110  CO2 18* 20 20  BUN 46.9* 50* 38*  CREATININE 3.8 Repeated and Verified* 3.65* 3.01*  CALCIUM 9.2 9.1 9.1  GLUCOSE 98 100* 97    CBG (last 3)   Recent Labs  10/25/13 1429  GLUCAP 108*    Scheduled Meds: . finasteride  5 mg Oral Daily  . heparin  5,000 Units Subcutaneous 3 times per day  . lamoTRIgine  200 mg Oral Daily  . pneumococcal 23 valent vaccine  0.5 mL Intramuscular Tomorrow-1000  . sodium chloride  3 mL Intravenous Q12H  . tamsulosin  0.4 mg Oral Daily    Continuous Infusions: . sodium chloride 75 mL/hr at 10/26/13 0539  Past Medical History  Diagnosis Date  . ADD (attention deficit disorder)   . Allergy   . Asthma   . Depression   . Bipolar 1 disorder   . Headache(784.0)     "~ daily recently" (10/25/2013)  . Anxiety   . Testicular cancer ~ 2003    "left"  . BPH (benign prostatic hypertrophy)     Archie Endo 10/25/2013    Past Surgical History  Procedure Laterality Date  . Knee surgery Left 1960's    "had bone spur removed"  . Finger fracture surgery Right 1990's    "put pin in my pinky"  . Colonoscopy w/ biopsies and polypectomy    . Testicle removal Left ~ 2003    Kallie Locks, [place], [facility] Pager # [phone] After hours/ weekend pager #  [phone]

## 2013-10-26 NOTE — Progress Notes (Signed)
Utilization review completed. Lianna Sitzmann, RN, BSN. 

## 2013-10-26 NOTE — Discharge Summary (Signed)
Physician Discharge Summary  Adam Herring MBW:[id number] DOB: 26-Apr-1950 DOA: 10/25/2013  PCP: Adam Man, MD  Admit date: 10/25/2013 Discharge date: 10/26/2013  Time spent: 40 minutes  Recommendations for Outpatient Follow-up:  1. Continue to monitor patient's BUN and Creatinine, BPH and urinary symptoms.  Remove foley catheter after voiding trial with urologist.  Pt has been instructed to follow up with his urologist asap. 2. Please check BMET at next visit  Discharge Diagnoses:  Principal Problem:   Acute urinary retention Active Problems:   Anemia, unspecified   Acute renal failure   Abnormal EKG   Hyperkalemia   Urinary retention   Discharge Condition: stable   Diet recommendation: low salt diet  Filed Weights   10/25/13 0656 10/25/13 1633  Weight: 80.287 kg (177 lb) 80.28 kg (176 lb 15.8 oz)    History of present illness:  63 year old male with PMH of Acute renal failure (admitted 10/04/13 discharged 10/14/13 with foley catheter), ADD and Bipolar disorder presented to the ED with urinary retention and difficulty urinating since last night. On recent admission, he had bilateral hydronephrosis and large prostate and was discharged with foley catheter, with urology follow up one week later. A week after discharge, patient had similar complaints of urinary rentention and dysuria, presented to [facility] urgent [facility] center and was found to have blood clot in catheter that was obstructing flow, and thus catheter was removed and replaced. Current episode started after having a bowel movement last night, he noticed a blood clot in the Foley catheter, and has not had any urine output since. Denies any blood in his stools. He states he's having perirectal pain and is concerned about recurrent hemorrhoids. He complains of metallic taste in his mouth, which he states is consistent with when he had ARF. In Ed, Foley catheter could be irrigated and was thus replaced. He is found to have  potassium of 5.9 and EKG changes of peaked T waves, which is new from prior EKG done 10/05/13. Patient is admitted  Hospital Course:  Principal Problem: Acute urinary retention -Secondary to clot retention- had foley catheter in place for BPH  -Changed catheter in hospital-no hematuria -Resolved.  Active Problems: Acute renal failure - Postrenal kidney injury due obstruction.  Foley catheter placed for BPH during admission 10/14/13.  Clot in foley catheter upon presentation to ED causing acute urinary retention.  Cr elevated 3.8 and BUN is 46.9 on 9/15, creatinine much better with supportive care.- Resolved with change of foley catheter.  Cr is 2.75 and CUN is 32 upon discharge.  Abnormal EKG - Mild Peaked T waves - due to hyperkalemia.  Mild Hyperkalemia - Potassium was 5.9 on 9/15.  Treated with one dose of Kayexalate.   - Resolved.  5.3 upon discharge.  Anemia, unspecified - Hb 8.2 upon admission on 9/15.  Continue iron supplements.  Consultations:  Urology  Discharge Exam: Filed Vitals:   10/26/13 0711  BP: 132/73  Pulse: 87  Temp: 98.3 F (36.8 C)  Resp: 16    General: Alert and oriented [name] in NAD.  Cardiovascular: RRR, no m/r/g. No LE edema.  Respiratory: CTA bilaterally, no w/r/r. Normal respiratory effort.  Abdomen: soft, nontender, nondistended abdomen.  No masses.  BS+ Neurologic: AxOx3.  CN III-XII grossly intact. Psychiatric: grossly normal mood and affect, speech fluent and appropriate   Discharge Instructions  Current Discharge Medication List    CONTINUE these medications which have NOT CHANGED   Details  albuterol (PROVENTIL HFA;VENTOLIN HFA)  108 (90 BASE) MCG/ACT inhaler Inhale 2 puffs into the lungs every 6 (six) hours as needed for wheezing or shortness of breath.     amphetamine-dextroamphetamine (ADDERALL) 30 MG tablet Take 15-30 mg by mouth 2 (two) times daily as needed (for aniexty).    ferrous sulfate 325 (65 FE) MG tablet  Take 325 mg by mouth daily with breakfast.    lamoTRIgine (LAMICTAL) 200 MG tablet Take 200 mg by mouth daily.    tamsulosin (FLOMAX) 0.4 MG CAPS capsule Take 1 capsule (0.4 mg total) by mouth daily. Qty: 30 capsule, Refills: 1       Allergies  Allergen Reactions  . Dust Mite Extract Shortness Of Breath  . Pollen Extract Shortness Of Breath  . Bean Pod Extract Other (See Comments)    Eczema   . Other Other (See Comments)    Potato's - eczema   . Sesame Oil Other (See Comments)    Severe sweating   . Tomato Other (See Comments)    Eczema       The results of significant diagnostics from this hospitalization (including imaging, microbiology, ancillary and laboratory) are listed below for reference.    Significant Diagnostic Studies: Ct Abdomen Pelvis Wo Contrast  10/05/2013   CLINICAL DATA:  Hydronephrosis, renal mass  EXAM: CT ABDOMEN AND PELVIS WITHOUT CONTRAST  TECHNIQUE: Multidetector CT imaging of the abdomen and pelvis was performed following the standard protocol without IV contrast.  COMPARISON:  Renal ultrasound 10/04/2013  FINDINGS: Sagittal images of the spine are unremarkable.  Unenhanced liver shows no biliary ductal dilatation. Gallbladder is contracted without evidence of calcified gallstones.  The study is limited without IV contrast. The pancreas spleen and adrenals are unremarkable. There is bilateral mild hydronephrosis and mild hydroureter. No calcified renal or ureteral calculi are identified. There is a exophytic probable cyst in midpole of the left kidney measures 9.4 mm and 10.3 Hounsfield units in attenuation.  Moderate colonic stool.  No small bowel obstruction. No ascites or free air. No adenopathy. There is a enlarged prostate gland with indentation of urinary bladder base. Prostate gland measures 5.9 x 4.7 cm. A Foley catheter is noted within a decompressed urinary bladder. Small amount of air within bladder is probable post instrumentation. There is  significant thickening of urinary bladder wall. This may be due to chronic inflammation or cystitis. Clinical correlation is necessary. No calcified calculi are noted within depressed urinary bladder.  IMPRESSION: 1. There is mild bilateral hydronephrosis and hydroureter. Probable cyst in midpole of the left kidney measures 9.4 mm. 2. No nephrolithiasis.  No calcified ureteral calculi. 3. Moderate colonic stool. 4. No small bowel or colonic obstruction. 5. There is enlarged prostate gland with indentation of the bladder base. Significant thickening of urinary bladder wall. This may be due to cystitis or chronic inflammation. Neoplastic process cannot be excluded. Correlation with urology exam is recommended.   Electronically Signed   By: Lahoma Crocker M.D.   On: 10/05/2013 13:37   US Renal  10/04/2013   CLINICAL DATA:  Acute renal failure.  History of testicular cancer.  EXAM: RENAL/URINARY TRACT ULTRASOUND COMPLETE  COMPARISON:  None.  FINDINGS: Right Kidney:  Length: 11.0 cm. Echogenicity within normal limits. Mild dilatation of the collecting system.  Left Kidney:  Length: 10.9 cm. Echogenicity within normal limits. No hydronephrosis visualized. 1.0 x 0.9 x 0.8 cm rounded, exophytic, hypoechoic mass arising from the midportion of the kidney.  Bladder:  Not visualized. Foley catheter in place. There is  a 4.6 x 4.1 x 3.9 cm rounded mass like area in the expected position of the urinary bladder.  IMPRESSION: 1. Mild right hydronephrosis. 2. 1.0 cm probable mildly complex exophytic cyst arising from the midportion of the left kidney. A solid mass cannot be excluded. This could be better determined with an elective, outpatient pre and postcontrast magnetic resonance imaging examination of the kidneys. 3. Probable moderately enlarged prostate gland in the inferior pelvis. A bladder mass is less likely.   Electronically Signed   By: Enrique Sack M.D.   On: 10/04/2013 21:00   Ct Biopsy  10/11/2013   INDICATION: History  of testicular cancer, now with concern for multiple myeloma. Please perform bone marrow biopsy and aspiration for tissue diagnostic purposes.  EXAM: CT GUIDED BONE MARROW BIOPSY AND ASPIRATION  MEDICATIONS: Fentanyl 150 mcg IV; Versed 4 mg IV  ANESTHESIA/SEDATION: Sedation Time  10 minutes  CONTRAST:  None  COMPLICATIONS: None immediate.  PROCEDURE: Informed consent was obtained from the patient following an explanation of the procedure, risks, benefits and alternatives. The patient understands, agrees and consents for the procedure. All questions were addressed. A time out was performed prior to the initiation of the procedure. The patient was positioned prone and non-contrast localization CT was performed of the pelvis to demonstrate the iliac marrow spaces. The operative site was prepped and draped in the usual sterile fashion.  Under sterile conditions and local anesthesia, a 22 gauge spinal needle was utilized for procedural planning. Next, an 11 gauge coaxial bone biopsy needle was advanced into the right iliac marrow space. Needle position was confirmed with CT imaging. Initially, bone marrow aspiration was performed. Next, a bone marrow biopsy was obtained with the 11 gauge outer bone marrow device. Samples were prepared with the cytotechnologist and deemed adequate. The needle was removed intact. Hemostasis was obtained with compression and a dressing was placed. The patient tolerated the procedure well without immediate post procedural complication.  IMPRESSION: Successful CT guided right iliac bone marrow aspiration and core biopsies.   Electronically Signed   By: Sandi Mariscal M.D.   On: 10/11/2013 12:10   Dg Bone Survey Met  10/12/2013   CLINICAL DATA:  Possible myeloma.  EXAM: METASTATIC BONE SURVEY  COMPARISON:  None.  FINDINGS: Imaging of the spine, ribs, upper extremities, lower extremities, and pelvis shows no evidence of myeloma. The lateral image of the skull is notable for a lucency near the  coronal suture measuring 6 mm. Ill-defined shaped favors a vascular structure rather than typical punched-out myelomatous focus. This could be followed depending on clinical suspicion for myeloma.  Shallow bony excrescence from the medial metadiaphysis of the proximal left tibia which favors sessile osteochondromata.  No evidence of active intrathoracic disease.  IMPRESSION: No findings typical of myeloma.  See discussion above.   Electronically Signed   By: Jorje Guild M.D.   On: 10/12/2013 16:46    Microbiology: Recent Results (from the past 240 hour(s))  URINE CULTURE     Status: None   Collection Time    10/25/13  9:47 AM      Result Value Ref Range Status   Specimen Description URINE, CATHETERIZED   Final   Special Requests NONE   Final   Culture  Setup Time     Final   Value: 10/25/2013 14:26     Performed at [facility]     Final   Value: NO GROWTH     Performed  at [facility]   Culture     Final   Value: NO GROWTH     Performed at [facility]   Report Status 10/26/2013 FINAL   Final     Labs: Basic Metabolic Panel:  Recent Labs Lab 10/24/13 1207 10/25/13 0756 10/26/13 0512 10/26/13 1301  NA 140 141 139 141  K 5.0 5.9* 5.5* 5.3  CL  --  109 110 110  CO2 18* 20 20 21   GLUCOSE 98 100* 97 92  BUN 46.9* 50* 38* 32*  CREATININE 3.8 Repeated and Verified* 3.65* 3.01* 2.75*  CALCIUM 9.2 9.1 9.1 9.0   CBC:  Recent Labs Lab 10/24/13 1208 10/25/13 0756 10/26/13 0512  WBC 5.2 5.2 4.8  NEUTROABS 3.0 3.0  --   HGB 8.2* 7.8* 7.9*  HCT 25.2* 23.9* 24.0*  MCV 97.1 94.8 94.9  PLT 227 226 244   CBG:  Recent Labs Lab 10/25/13 1429  GLUCAP 108*   Signed:  Keely Reichel, PA-S  [facility] 10/26/2013, 2:31 PM  Attending Patient was seen, examined,treatment plan was discussed with the Physician extender. I have directly reviewed the clinical findings, lab, imaging studies and management of this patient in detail.  I have made the necessary changes to the above noted documentation, and agree with the documentation, as recorded by the Physician extender.  Nena Alexander MD [facility].

## 2013-10-27 ENCOUNTER — Ambulatory Visit (HOSPITAL_BASED_OUTPATIENT_CLINIC_OR_DEPARTMENT_OTHER): Payer: BC Managed Care – PPO | Admitting: Nurse Practitioner

## 2013-10-27 ENCOUNTER — Telehealth: Payer: Self-pay | Admitting: Oncology

## 2013-10-27 ENCOUNTER — Ambulatory Visit: Payer: BC Managed Care – PPO

## 2013-10-27 VITALS — BP 130/73 | HR 96 | Temp 98.8°F | Resp 18 | Ht 75.0 in | Wt 178.1 lb

## 2013-10-27 DIAGNOSIS — D472 Monoclonal gammopathy: Secondary | ICD-10-CM

## 2013-10-27 DIAGNOSIS — N139 Obstructive and reflux uropathy, unspecified: Secondary | ICD-10-CM

## 2013-10-27 DIAGNOSIS — N19 Unspecified kidney failure: Secondary | ICD-10-CM

## 2013-10-27 DIAGNOSIS — F319 Bipolar disorder, unspecified: Secondary | ICD-10-CM

## 2013-10-27 DIAGNOSIS — D619 Aplastic anemia, unspecified: Secondary | ICD-10-CM

## 2013-10-27 DIAGNOSIS — D649 Anemia, unspecified: Secondary | ICD-10-CM

## 2013-10-27 NOTE — Progress Notes (Addendum)
[place] OFFICE PROGRESS NOTE   Diagnosis:  Anemia, renal failure  INTERVAL HISTORY:   Mr. Nordin is seen in his first visit following hospital discharge. He was hospitalized on 10/04/2013 with acute renal failure and anemia. The renal failure was felt to possibly be secondary to obstructive uropathy. A Foley catheter was placed. He was found to have a low level serum M spike. Serum kappa light chains were mildly elevated in the setting of renal failure. He had a normal IgG and IgA level and no lytic lesions were seen on an abdominal CT scan. He had mild proteinuria with a low level IgG kappa protein.   Dr. Benay Spice saw him in consultation on 10/10/2013 due to concern for possible multiple myeloma.   Bone marrow biopsy 10/11/2013 showed a slightly hypercellular bone marrow for age with erythroid hyperplasia. Plasma cells were slightly increased in number representing 9% of all cells with no large clusters or sheets. Immunohistochemical stains showed polyclonal staining pattern in the plasma cells although there was slight kappa light chain excess. Cytogenetic analysis showed normal male chromosomes with no observable clonal chromosomal abnormalities.  He was discharged on 10/14/2013. He was seen in the emergency Department on 10/25/2013 with urinary retention. He was found to have a clot in the Foley catheter. The Foley catheter was changed. He was discharged the following day.  He reports good urine output from the Foley. No bleeding. No fevers or sweats. He has a good appetite. He denies pain. No shortness of breath. No chest pain.  He plans to relocate to [facility] in the next one to 2 days.    Objective:  Vital signs in last 24 hours:  Blood pressure 130/73, pulse 96, temperature 98.8 F (37.1 C), temperature source Oral, resp. rate 18, height 6' 3"  (1.905 m), weight 178 lb 1.6 oz (80.786 kg), SpO2 98.00%.    HEENT: No thrush or ulcers. Lymphatics:  No palpable cervical, supraclavicular or axillary lymph nodes. Resp: Lungs clear bilaterally. Cardio: Regular rate and rhythm. GI: Abdomen soft and nontender. No organomegaly. Vascular: No leg edema.   Lab Results:  Lab Results  Component Value Date   WBC 4.8 10/26/2013   HGB 7.9* 10/26/2013   HCT 24.0* 10/26/2013   MCV 94.9 10/26/2013   PLT 244 10/26/2013   NEUTROABS 3.0 10/25/2013  BUN 32, creatinine 2.75  The peripheral blood smear was reviewed on 10/10/2013.  A few ovalocytes. The white cell morphology is unremarkable. The platelets are normal in number. No schistocytes.  Mild proteinuria with a low level IgG kappa protein. Serum M spike 0.63  IgG 1460, IgA 2078, IgM 45  Serum free kappa light chains 13.7, free lambda light chains 14.44. Ratio 3.09   Imaging:  No results found.  Medications: I have reviewed the patient's current medications.  Assessment/Plan: 1. Acute renal failure, question due to obstructive uropathy. Improved. 2. Severe anemia. Question secondary to renal failure, question other etiology. Bone marrow 10/11/2013 with 9% plasma cells. 3. Indwelling Foley catheter. 4. 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 Prostatic hypertrophy 6. History of a testicular seminoma, status post orchiectomy and radiation in 2003 7. Bipolar disease   Disposition: Mr. Fecher has persistent severe anemia. The anemia may be due to renal failure. Kidney function was better on labs done yesterday.   Dr. Benay Spice discussed a trial of erythropoietin with Mr. Panchal at today's visit. He is relocating to [facility] in  the next few days and prefers to initiate any therapy after he has moved. We made a referral to [facility].   He has an indwelling Foley catheter. He will contact the [facility] here to request a referral to a urologist in [facility].   We did not schedule a followup visit. We will be happy  to see him anytime in the future.  Patient seen with Dr. Benay Spice. 25 minutes were spent face-to-face at today's visit with the majority of that time involved in counseling/coordination of care.   Ned Card ANP/GNP-BC   10/27/2013  4:33 PM  This was shared visit with Ned Card. Mr. Langille has a serum/urine monoclonal protein, anemia, and renal failure. A bone marrow biopsy revealed only 9% plasma cells that appeared polyclonal. A bone survey was negative. He had a 24-hour urine protein, but electrophoresis was not performed.  Nephrology feels the renal failure is most likely related to obstructive nephropathy from BPH. The creatinine has improved.  He either has a monoclonal gammopathy of unknown significance or early multiple myeloma.  The anemia is most likely secondary to renal failure. He declined Procrit today.  He plans to relocate to [facility] within the next few days. We will make a referral to the [facility]. He will need close followup of the anemia, renal failure, and monoclonal protein. He should receive erythropoietin and iron if the anemia does not improve. He will need to be referred to a urologist in [place].  I plan to contact the oncologist at [facility] when an appointment has been scheduled.  We will be glad to see him here in the future as needed.   Julieanne Manson, M.D.

## 2013-10-27 NOTE — Telephone Encounter (Signed)
Per 09/17 referral sent to [facility] for onocology in [place], pt is relocating to [place] area.Marland Kitchen..KJ

## 2013-10-28 ENCOUNTER — Telehealth: Payer: Self-pay | Admitting: Oncology

## 2013-10-28 NOTE — Telephone Encounter (Signed)
Faxed pt medical records to Dr. Eliezer Champagne office. The np cord. Will call pt with appt.

## 2013-10-31 LAB — UPEP/TP, 24-HR URINE
Albumin: 28.4 %
Alpha-1-Globulin, U: 26.9 %
Alpha-2-Globulin, U: 14.6 %
Beta Globulin, U: 16 %
COLLECTION INTERVAL: 24 h
Gamma Globulin, U: 14.1 %
TOTAL PROTEIN, URINE: 16 mg/dL
Total Protein, Urine/Day: 448 mg/d — ABNORMAL HIGH (ref 50–100)
Total Volume, Urine: 2800 mL

## 2013-11-01 ENCOUNTER — Telehealth: Payer: Self-pay | Admitting: Oncology

## 2013-11-01 NOTE — Telephone Encounter (Signed)
Pt appt. To see Dr. Roselyn Bering is 11/09/13@2 :44. Medical records faxed. Pt is aware

## 2013-11-02 NOTE — Progress Notes (Signed)
Note/chart reviewed. Agree with note.   Duquan Gillooly RD, LDN, CNSC [phone] Pager [phone] After Hours Pager   

## 2013-11-07 ENCOUNTER — Encounter: Payer: Self-pay | Admitting: Family Medicine

## 2013-11-07 ENCOUNTER — Telehealth: Payer: Self-pay | Admitting: *Deleted

## 2013-11-07 NOTE — Telephone Encounter (Signed)
Dr. Benay Spice discussed case with Dr. Roselyn Bering [phone]) today.

## 2013-11-10 ENCOUNTER — Encounter: Payer: Self-pay | Admitting: Oncology

## 2015-05-02 IMAGING — CR DG BONE SURVEY MET
9 of 10 series · 9 of 10 positions shown · non-contrast
Comparison: None.

CLINICAL DATA: Possible myeloma.

EXAM:
METASTATIC BONE SURVEY

[w c-spine lat *]
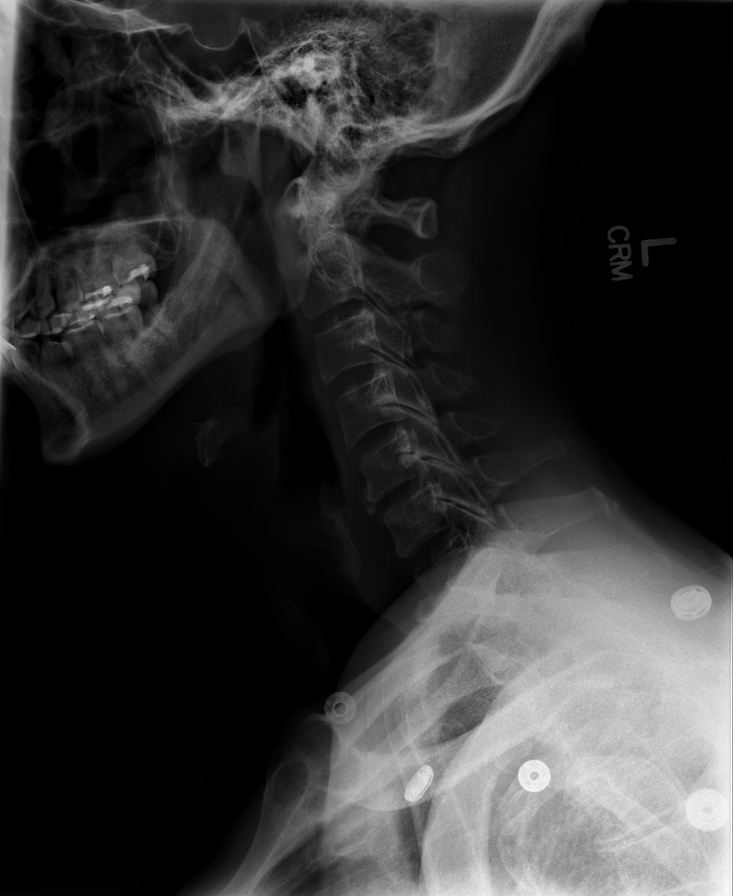

[t c-spine a.p.]
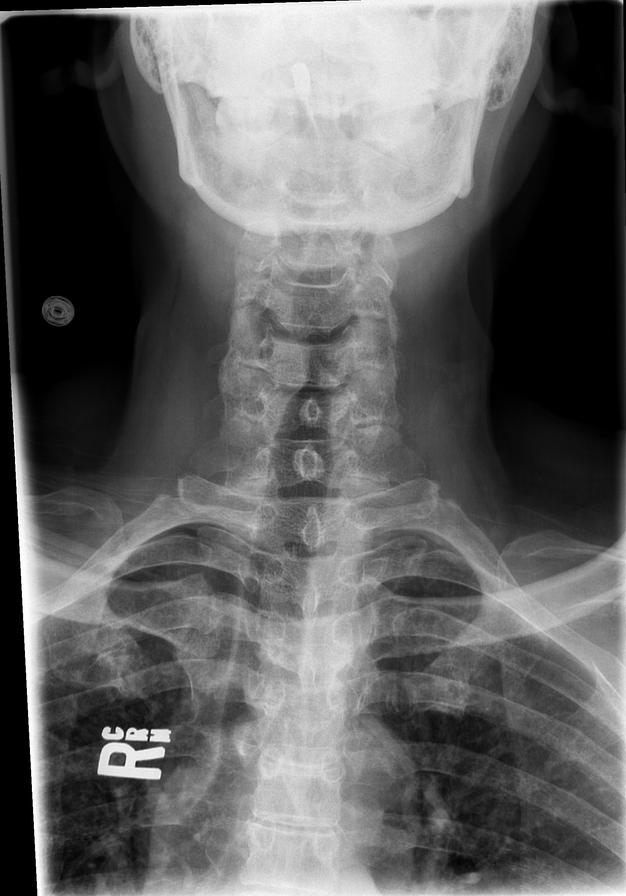

[t c-spine a.p. *]
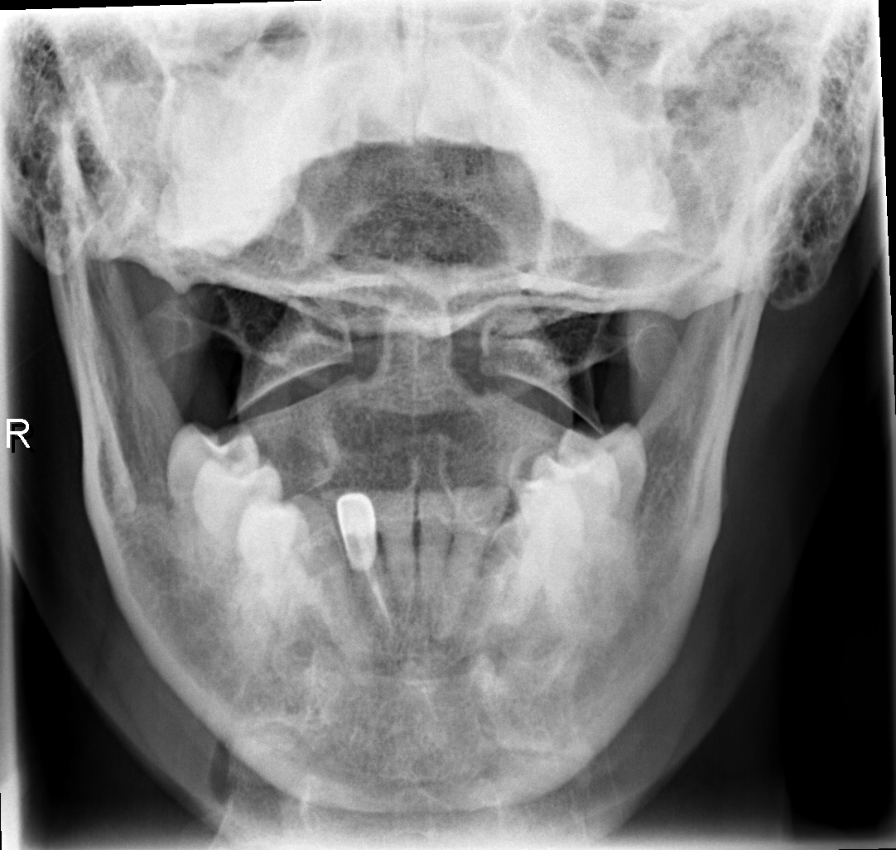

[t t-spine a.p.]
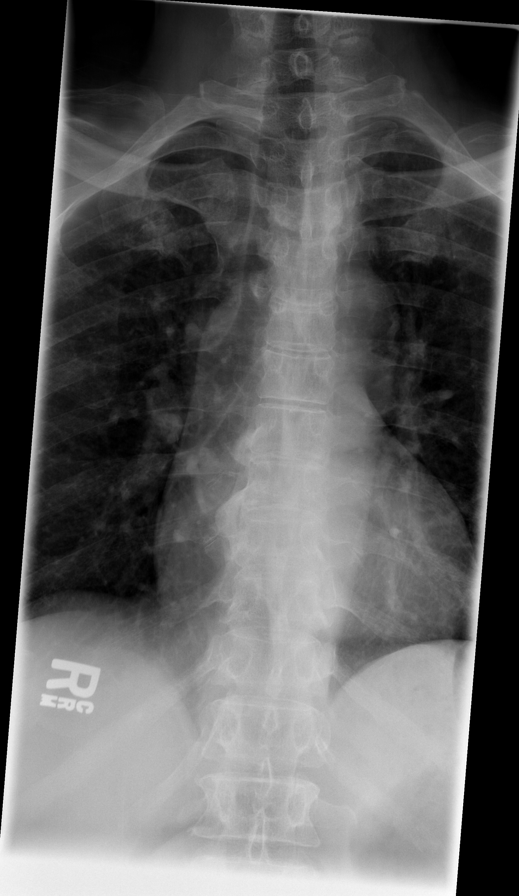

[t shoulder ap external left]
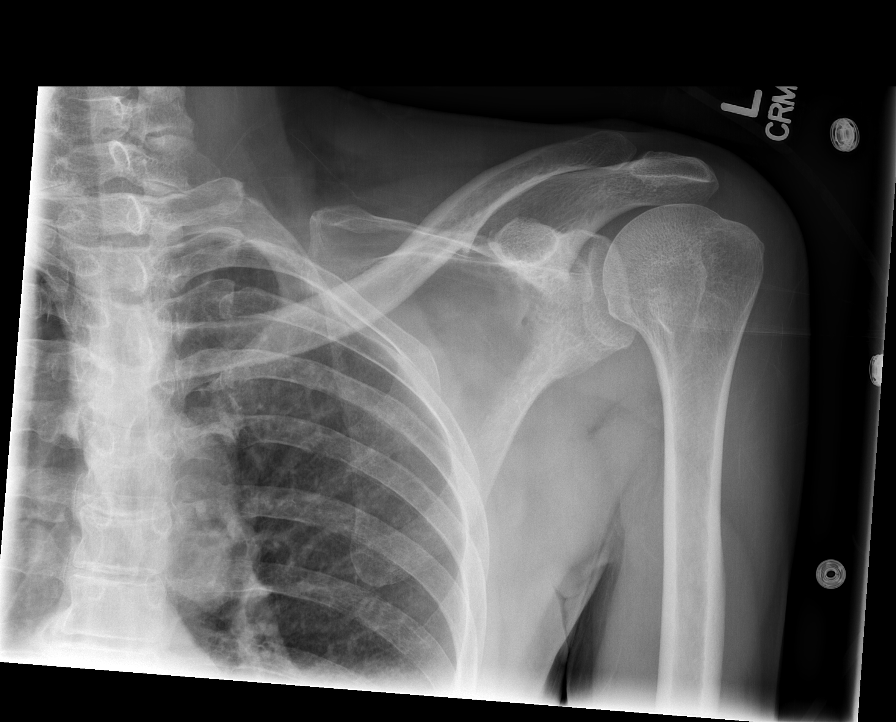

[t humerus ap left *]
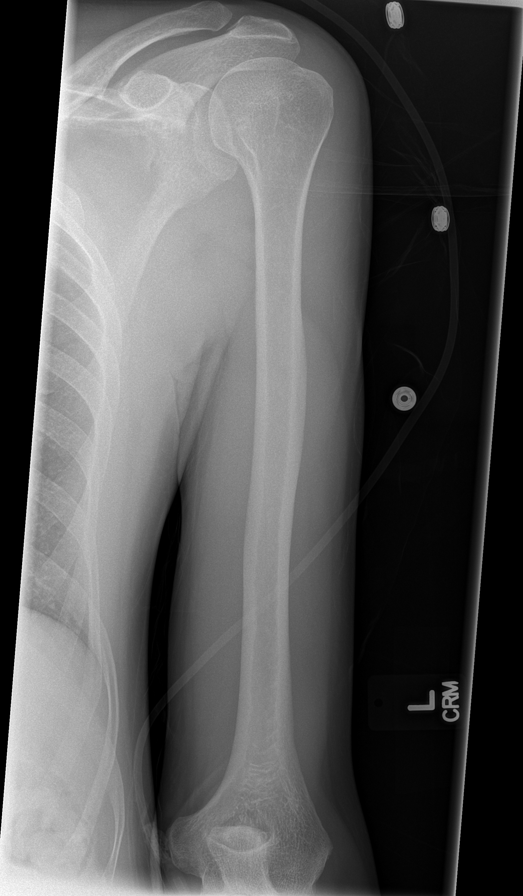

[t shoulder ap external righ]
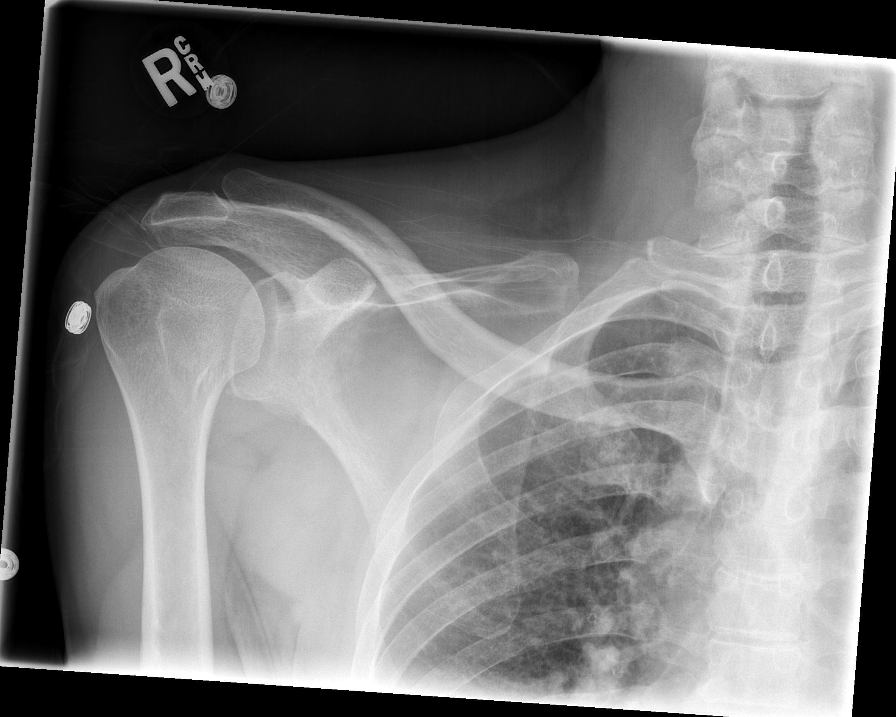

[t humerus ap right *]
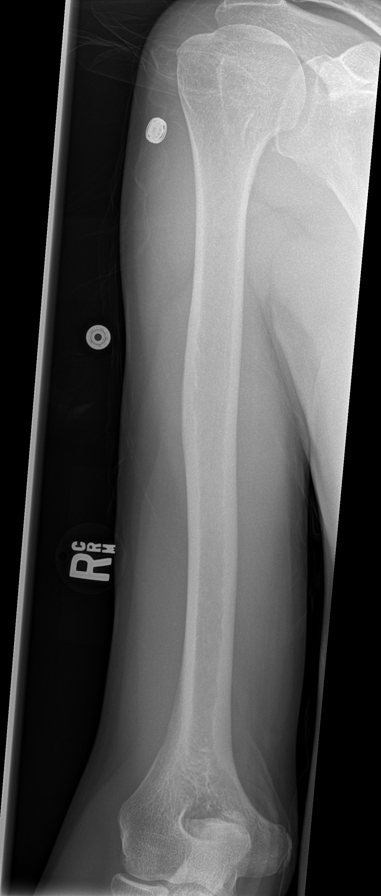

[t l-spine a.p.]
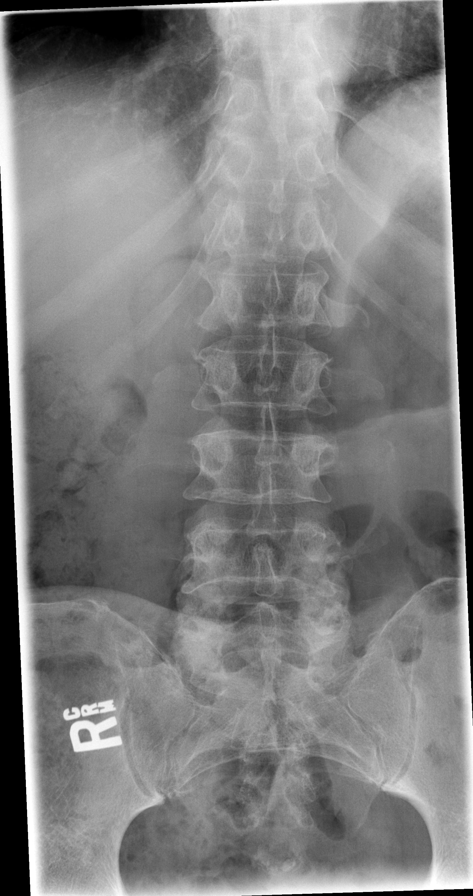

[9 of 10 positions shown; findings below may reference images not displayed]

FINDINGS: Imaging of the spine, ribs, upper extremities, lower extremities,
and pelvis shows no evidence of myeloma. The lateral image of the
skull is notable for a lucency near the coronal suture measuring 6
mm. Ill-defined shaped favors a vascular structure rather than
typical punched-out myelomatous focus. This could be followed
depending on clinical suspicion for myeloma.

Shallow bony excrescence from the medial metadiaphysis of the
proximal left tibia which favors sessile osteochondromata.

No evidence of active intrathoracic disease.
IMPRESSION: No findings typical of myeloma.  See discussion above.

## 2016-10-31 ENCOUNTER — Encounter: Payer: Self-pay | Admitting: Family Medicine
